# Patient Record
Sex: Male | Born: 1965
Health system: Southern US, Community
[De-identification: ages and names within clinical notes are randomized; demographics above are authoritative.]

## PROBLEM LIST (undated history)

## (undated) DIAGNOSIS — G4733 Obstructive sleep apnea (adult) (pediatric): Secondary | ICD-10-CM

## (undated) DIAGNOSIS — B354 Tinea corporis: Secondary | ICD-10-CM

## (undated) DIAGNOSIS — G4730 Sleep apnea, unspecified: Secondary | ICD-10-CM

## (undated) DIAGNOSIS — E7800 Pure hypercholesterolemia, unspecified: Secondary | ICD-10-CM

## (undated) DIAGNOSIS — M109 Gout, unspecified: Secondary | ICD-10-CM

## (undated) DIAGNOSIS — S838X9A Sprain of other specified parts of unspecified knee, initial encounter: Secondary | ICD-10-CM

## (undated) DIAGNOSIS — R319 Hematuria, unspecified: Secondary | ICD-10-CM

## (undated) DIAGNOSIS — I10 Essential (primary) hypertension: Secondary | ICD-10-CM

## (undated) HISTORY — PX: FINGER SURGERY: SHX640

## (undated) HISTORY — DX: Tinea corporis: B35.4

## (undated) HISTORY — DX: Pure hypercholesterolemia, unspecified: E78.00

## (undated) HISTORY — PX: KNEE SURGERY: SHX244

## (undated) HISTORY — DX: Obstructive sleep apnea (adult) (pediatric): G47.33

## (undated) HISTORY — DX: Sprain of other specified parts of unspecified knee, initial encounter: S83.8X9A

## (undated) HISTORY — PX: POPLITEAL SYNOVIAL CYST EXCISION: SUR555

## (undated) HISTORY — DX: Hematuria, unspecified: R31.9

## (undated) HISTORY — DX: Essential (primary) hypertension: I10

## (undated) HISTORY — DX: Gout, unspecified: M10.9

## (undated) HISTORY — PX: OTHER SURGICAL HISTORY: SHX169

## (undated) HISTORY — DX: Sleep apnea, unspecified: G47.30

---

## 1988-03-15 DIAGNOSIS — S838X9A Sprain of other specified parts of unspecified knee, initial encounter: Secondary | ICD-10-CM

## 1988-03-15 HISTORY — PX: KNEE ARTHROSCOPY W/ MENISCAL REPAIR: SHX1877

## 1988-03-15 HISTORY — DX: Sprain of other specified parts of unspecified knee, initial encounter: S83.8X9A

## 1999-10-31 ENCOUNTER — Emergency Department (HOSPITAL_COMMUNITY): Admission: EM | Admit: 1999-10-31 | Discharge: 1999-10-31 | Payer: Self-pay | Admitting: Emergency Medicine

## 2000-10-12 ENCOUNTER — Ambulatory Visit (HOSPITAL_COMMUNITY): Admission: RE | Admit: 2000-10-12 | Discharge: 2000-10-12 | Payer: Self-pay | Admitting: Surgery

## 2000-10-12 ENCOUNTER — Encounter (INDEPENDENT_AMBULATORY_CARE_PROVIDER_SITE_OTHER): Payer: Self-pay | Admitting: Specialist

## 2009-11-04 ENCOUNTER — Ambulatory Visit: Payer: Self-pay | Admitting: Cardiovascular Disease

## 2010-05-14 ENCOUNTER — Ambulatory Visit (INDEPENDENT_AMBULATORY_CARE_PROVIDER_SITE_OTHER): Payer: BC Managed Care – PPO | Admitting: Cardiovascular Disease

## 2010-05-14 DIAGNOSIS — E7800 Pure hypercholesterolemia, unspecified: Secondary | ICD-10-CM

## 2010-07-31 NOTE — Op Note (Signed)
[place]  Patient:    Jacob Aguirre, Jacob Aguirre                      MRN: [id number] Proc. Date: 10/12/00 Adm. Date:  [id number] Attending:  Andre Lefort CC:         Teena Irani. Arlyce Dice, M.D.   Operative Report  PREOPERATIVE DIAGNOSIS:  Pilonidal cyst/sinus.  POSTOPERATIVE DIAGNOSIS:  Pilonidal cyst/sinus.  OPERATION PERFORMED:  Pilonidal cyst excision.  SURGEON:  Sandria Bales. Ezzard Standing, M.D.  ASSISTANT:  None.  ANESTHESIA:  MAC with approximately 20 cc of 0.25% Marcaine.  COMPLICATIONS:  None.  INDICATIONS FOR PROCEDURE:  The patient is a 45 year old white male who has had recurrent pilonidal cyst infection and now comes for excision of this cyst cavity.  DESCRIPTION OF PROCEDURE:  The patient was placed in a prone position with his buttocks shaved and taped apart.  He was given IV sedation and I used 0.25% Marcaine plain as a local injection as a block.  I then excised an approximately 3 to 4 cm length and about 2 cm width area.  I marked the pilonidal cyst with an injection of methylene blue and excised this entire tract.  I controlled hemostasis with Bovie electrocautery and then packed it with Betadine gauze and sterilely dressed it.  He will start dressing changes at home with sitz baths three times a day and see me back in about five to seven days. DD:  10/12/00 TD:  10/12/00 Job: [id number]

## 2011-03-06 ENCOUNTER — Other Ambulatory Visit: Payer: Self-pay | Admitting: Cardiovascular Disease

## 2011-05-06 ENCOUNTER — Encounter: Payer: Self-pay | Admitting: *Deleted

## 2011-05-10 ENCOUNTER — Telehealth: Payer: Self-pay | Admitting: *Deleted

## 2011-05-10 ENCOUNTER — Other Ambulatory Visit: Payer: 59

## 2011-05-10 DIAGNOSIS — I10 Essential (primary) hypertension: Secondary | ICD-10-CM

## 2011-05-10 DIAGNOSIS — E785 Hyperlipidemia, unspecified: Secondary | ICD-10-CM

## 2011-05-10 NOTE — Telephone Encounter (Signed)
Walk in for labs, orders given

## 2011-05-17 ENCOUNTER — Ambulatory Visit (INDEPENDENT_AMBULATORY_CARE_PROVIDER_SITE_OTHER): Payer: 59 | Admitting: Cardiovascular Disease

## 2011-05-17 ENCOUNTER — Encounter: Payer: Self-pay | Admitting: Cardiovascular Disease

## 2011-05-17 VITALS — BP 130/85 | HR 56 | Ht 71.0 in | Wt 237.8 lb

## 2011-05-17 DIAGNOSIS — G4730 Sleep apnea, unspecified: Secondary | ICD-10-CM

## 2011-05-17 DIAGNOSIS — G4733 Obstructive sleep apnea (adult) (pediatric): Secondary | ICD-10-CM | POA: Insufficient documentation

## 2011-05-17 DIAGNOSIS — E785 Hyperlipidemia, unspecified: Secondary | ICD-10-CM | POA: Insufficient documentation

## 2011-05-17 DIAGNOSIS — I10 Essential (primary) hypertension: Secondary | ICD-10-CM | POA: Insufficient documentation

## 2011-05-17 DIAGNOSIS — R0683 Snoring: Secondary | ICD-10-CM

## 2011-05-17 LAB — LIPID PANEL
Cholesterol: 211 mg/dL — ABNORMAL HIGH (ref 0–200)
HDL: 41.6 mg/dL (ref >39.00)
Total CHOL/HDL Ratio: 5
Triglycerides: 477 mg/dL — ABNORMAL HIGH (ref 0.0–149.0)
VLDL: 95.4 mg/dL — ABNORMAL HIGH (ref 0.0–40.0)

## 2011-05-17 LAB — BASIC METABOLIC PANEL
BUN: 18 mg/dL (ref 6–23)
CO2: 27 mEq/L (ref 19–32)
Calcium: 9.5 mg/dL (ref 8.4–10.5)
Chloride: 103 mEq/L (ref 96–112)
Creatinine, Ser: 1 mg/dL (ref 0.4–1.5)
GFR: 84.43 mL/min (ref >60.00)
Glucose, Bld: 87 mg/dL (ref 70–99)
Potassium: 4.2 mEq/L (ref 3.5–5.1)
Sodium: 137 mEq/L (ref 135–145)

## 2011-05-17 LAB — HEPATIC FUNCTION PANEL
ALT: 49 U/L (ref 0–53)
AST: 32 U/L (ref 0–37)
Albumin: 4.5 g/dL (ref 3.5–5.2)
Alkaline Phosphatase: 42 U/L (ref 39–117)
Bilirubin, Direct: 0 mg/dL (ref 0.0–0.3)
Total Bilirubin: 0.7 mg/dL (ref 0.3–1.2)
Total Protein: 7.3 g/dL (ref 6.0–8.3)

## 2011-05-17 LAB — LDL CHOLESTEROL, DIRECT: Direct LDL: 76.5 mg/dL

## 2011-05-17 NOTE — Progress Notes (Signed)
    Jacob Aguirre Date of Birth  10/29/1965 [facility]     [place]  [address]    [address]   [address] [place], [place]  [address]    [place], [place]  [address] [phone]  Fax  [phone]  [phone]  Fax [phone]  Problems: 1. Hypercholesterolemia 2. Hypertension  History of Present Illness:  Jacob Aguirre has done fairly well since I last saw him. He is trying to eat a little bit better. He's not had any episodes of chest pain or shortness breath.  Current Outpatient Prescriptions on File Prior to Visit  Medication Sig Dispense Refill  . ASPIRIN PO Take 81 mg by mouth daily.       Marland Kitchen lisinopril (PRINIVIL,ZESTRIL) 20 MG tablet Take 20 mg by mouth daily.      . niacin (NIASPAN) 500 MG CR tablet Take 500 mg by mouth at bedtime.      . Omega-3 Fatty Acids (OMEGA 3 PO) Take by mouth 3 (three) times daily.        Allergies  Allergen Reactions  . Niacin And Related     Hives  . Penicillins     Causes Hives    Past Medical History  Diagnosis Date  . HTN (hypertension)   . Hypercholesterolemia     Past Surgical History  Procedure Date  . Finger surgery   . Knee surgery     Broken Knee Cap  . Knee cartilage surgery     Right torn  . Cystectomy     Right leg     History  Smoking status  . Former Smoker  Smokeless tobacco  . Not on file  Comment: Quite is 2002    History  Alcohol Use: Not on file    No family history on file.  Reviw of Systems:  Reviewed in the HPI.  All other systems are negative.  Physical Exam: Blood pressure 142/91, pulse 56, height 5\' 11"  (1.803 m), weight 237 lb 12.8 oz (107.865 kg). General: Well developed, well nourished, in no acute distress.  Head: Normocephalic, atraumatic, sclera non-icteric, mucus membranes are moist,   Neck: Supple. Carotids are 2 + without bruits. No JVD  Lungs: Clear bilaterally to auscultation.  Heart: regular rate  With normal  S1 S2. No murmurs, gallops or  rubs.  Abdomen: Soft, non-tender, non-distended with normal bowel sounds. No hepatomegaly. No rebound/guarding. No masses.  Msk:  Strength and tone are normal  Extremities: No clubbing or cyanosis. No edema.  Distal pedal pulses are 2+ and equal bilaterally.  Neuro: Alert and oriented X 3. Moves all extremities spontaneously.  Psych:  Responds to questions appropriately with a normal affect.  ECG: NSR, no ST or T wave changes.   Assessment / Plan:

## 2011-05-17 NOTE — Assessment & Plan Note (Signed)
We'll check fasting labs on him today. They drew them last week but somehow samples were not reported. I suspect that there was some sort of lab error or labeling mistake

## 2011-05-17 NOTE — Assessment & Plan Note (Signed)
Jacob Aguirre is doing fairly well. We'll continue with a good diet and exercise program program. I've asked him to make sure that he does not need any extra salt.

## 2011-05-17 NOTE — Patient Instructions (Addendum)
Your physician has recommended that you have a sleep study. This test records several body functions during sleep, including: brain activity, eye movement, oxygen and carbon dioxide blood levels, heart rate and rhythm, breathing rate and rhythm, the flow of air through your mouth and nose, snoring, body muscle movements, and chest and belly movement.  Your physician wants you to follow-up in: 6 MONTHS You will receive a reminder letter in the mail two months in advance. If you don't receive a letter, please call our office to schedule the follow-up appointment.  Your physician recommends that you return for a FASTING lipid profile: TODAY SINCE LAB DIDN'T RECEIVE BLOOD FROM 05/10/11

## 2011-05-17 NOTE — Assessment & Plan Note (Signed)
Jacob Aguirre describes symptoms and signs of sleep apnea. He snores quite loudly. His wife states that he quits breathing at night. Given these signs, I think it we should schedule him for a sleep study. This may also explain some of his hypertension.

## 2011-05-18 ENCOUNTER — Other Ambulatory Visit: Payer: Self-pay | Admitting: *Deleted

## 2011-05-18 ENCOUNTER — Telehealth: Payer: Self-pay | Admitting: Cardiovascular Disease

## 2011-05-18 DIAGNOSIS — E785 Hyperlipidemia, unspecified: Secondary | ICD-10-CM

## 2011-05-18 MED ORDER — FENOFIBRATE 160 MG PO TABS: 160.0000 mg | ORAL_TABLET | Freq: Every day | ORAL | Status: DC

## 2011-05-18 NOTE — Telephone Encounter (Signed)
ASKED HIM TO CALL BACK, STATED HE NEEDED A NEW MED ADDED. LABS ORDERED, MED ORDERED. DATE SET FOR LAB REDRAW.

## 2011-05-18 NOTE — Telephone Encounter (Signed)
FU Call: Pt returning call to Jodette. Please return pt call to discuss further.  

## 2011-05-18 NOTE — Progress Notes (Signed)
PT CALLED BACK INSTRUCTIONS GIVEN FROM LABS

## 2011-05-18 NOTE — Telephone Encounter (Signed)
OPENED IN ERROR

## 2011-05-25 ENCOUNTER — Other Ambulatory Visit: Payer: Self-pay | Admitting: Cardiovascular Disease

## 2011-05-25 DIAGNOSIS — E785 Hyperlipidemia, unspecified: Secondary | ICD-10-CM

## 2011-05-25 DIAGNOSIS — I10 Essential (primary) hypertension: Secondary | ICD-10-CM

## 2011-05-25 DIAGNOSIS — R0683 Snoring: Secondary | ICD-10-CM

## 2011-06-01 ENCOUNTER — Ambulatory Visit (HOSPITAL_BASED_OUTPATIENT_CLINIC_OR_DEPARTMENT_OTHER): Payer: 59 | Attending: Cardiovascular Disease | Admitting: Radiology

## 2011-06-01 VITALS — Ht 71.0 in | Wt 239.0 lb

## 2011-06-01 DIAGNOSIS — G4733 Obstructive sleep apnea (adult) (pediatric): Secondary | ICD-10-CM | POA: Insufficient documentation

## 2011-06-01 DIAGNOSIS — R0683 Snoring: Secondary | ICD-10-CM

## 2011-06-13 DIAGNOSIS — G4733 Obstructive sleep apnea (adult) (pediatric): Secondary | ICD-10-CM

## 2011-06-13 DIAGNOSIS — R0609 Other forms of dyspnea: Secondary | ICD-10-CM

## 2011-06-13 DIAGNOSIS — R0989 Other specified symptoms and signs involving the circulatory and respiratory systems: Secondary | ICD-10-CM

## 2011-06-14 NOTE — Procedures (Signed)
Jacob Aguirre, Jacob Aguirre               ACCOUNT NO.:  [id number]  MEDICAL RECORD NO.:  [id number]          PATIENT TYPE:  OUT  LOCATION:  SLEEP CENTER                 FACILITY:  [facility]  PHYSICIAN:  Barbaraann Share, MD,FCCPDATE OF BIRTH:  12-20-67  DATE OF STUDY:  06/01/2011                           NOCTURNAL POLYSOMNOGRAM  REFERRING PHYSICIAN:  Vesta Mixer, M.D.  INDICATION FOR STUDY:  Hypersomnia with sleep apnea.  EPWORTH SLEEPINESS SCORE:  10.  MEDICATIONS:  SLEEP ARCHITECTURE:  The patient had a total sleep time of 356 minutes with no slow-wave sleep and only 78 minutes of REM.  Sleep onset latency was normal at 12 minutes and REM did not occur until late in the titration portion of the study.  Sleep efficiency was poor at 63% during the diagnostic portion, and normal during the titration portion at 91%.  RESPIRATORY DATA:  The patient underwent a split night protocol where he was found to have 119 obstructive events in the first 128 minutes of sleep.  This gave him an apnea-hypopnea index of 56 events per hour during the diagnostic portion of the study.  The events occurred in all body positions, there was loud snoring noted throughout. By protocol, the patient was then fitted with a medium ResMed Mirage Quattro full face mask, and CPAP titration was initiated.  He was found to have resolution of his apneic events with a pressure of 11 cm, however, further titration was done in order to stop breakthrough snoring. However, this just resulted in pressure induced central apneas.  OXYGEN DATA:  His O2 desaturation was low as 81% with the patient's obstructive events.  CARDIAC DATA:  No clinically significant arrhythmias were noted.  MOVEMENT-PARASOMNIA:  The patient had no leg jerks or other abnormal behaviors seen.  IMPRESSIONS-RECOMMENDATIONS:  Split night study reveals severe obstructive sleep apnea/hypopnea syndrome with an AHI of 56 events per hour, and oxygen  desaturation as low as 81%.  The patient was then fitted with a medium ResMed Mirage Quattro full face mask and found to have an optimal CPAP pressure of 11 cm in order to treat his obstructive events.  He did have some mild breakthrough snoring at that pressure, however, attempts to titrate further resulted in pressure induced central apneas.  I would recommend titrating his pressure at home with an auto titrating device with download.  The patient should also be encouraged to work aggressively on weight loss.     Barbaraann Share, MD,FCCP Diplomate, American Board of Sleep Medicine    [facility]/MED[facility]  D:  06/13/2011 16:07:35  T:  06/14/2011 11:91:47  Job:  [id number]

## 2011-06-23 ENCOUNTER — Other Ambulatory Visit: Payer: Self-pay | Admitting: Cardiovascular Disease

## 2011-06-24 NOTE — Telephone Encounter (Signed)
Fax Received. Refill Completed. Miller Edgington Chowoe (R.M.A)   

## 2011-07-27 ENCOUNTER — Telehealth: Payer: Self-pay | Admitting: *Deleted

## 2011-07-27 NOTE — Telephone Encounter (Signed)
i have made several attempts to contact with results, cell phone identifies his first and last name, i left a message asking him to call back / number provided 3-4 times. i left MSG today asking him if he needs a referral to pulmonology for f/u for CPAP fitting for sleep apnea, number provided.

## 2011-07-27 NOTE — Telephone Encounter (Signed)
     Message     Jacob Aguirre has severe sleep apnea. It seems to be effectively treated with the CPAP. He should continue to follow up with Dr. Maple Hudson for his sleep apnea.   ----- Message ----- From: Antony Odea, RN Sent: 07/05/2011 3:59 PM To: Vesta Mixer, MD  I

## 2011-07-28 ENCOUNTER — Other Ambulatory Visit: Payer: Self-pay

## 2011-07-28 ENCOUNTER — Telehealth: Payer: Self-pay | Admitting: Cardiovascular Disease

## 2011-07-28 DIAGNOSIS — G4730 Sleep apnea, unspecified: Secondary | ICD-10-CM

## 2011-07-28 NOTE — Progress Notes (Signed)
Message from Dr Elease Hashimoto is as follows: "Jacob Aguirre has severe sleep apnea. It seems to be effectively treated with the CPAP. He should continue to follow up with Dr. Maple Hudson for his sleep apnea".  Pt states he has never seen Dr Maple Hudson or any other pulmonologist for sleep apnea.  Appt was scheduled with Dr Maple Hudson.

## 2011-07-28 NOTE — Telephone Encounter (Signed)
Pt calling back and given appoint time and date to see dr young about his sleep apnea--pt states he can't make that appoint so i gave him dr Roxy Cedar office number and he states he will call and change his appoint to a time and date he can make--pt agrees--nt

## 2011-07-28 NOTE — Telephone Encounter (Signed)
New Problem:    Patient returned Jacob Aguirre's call.  Please call back.

## 2011-08-18 ENCOUNTER — Other Ambulatory Visit: Payer: 59

## 2011-08-19 ENCOUNTER — Telehealth: Payer: Self-pay | Admitting: *Deleted

## 2011-08-19 NOTE — Telephone Encounter (Signed)
Message copied by Antony Odea on Thu Aug 19, 2011 12:01 PM ------      Message from: Worthy Rancher D      Created: Wed Jul 28, 2011 10:22 AM      Regarding: Appt       I am sorry but I accidentally deleted the staff message on this pt from your basket.  I notified him of his sleep study results and made a referral to Dr Maple Hudson for him.  Appt is on 09/02/11 at 10:30am.  I am still trying to reach him with the date and time.  I called him at the number he gave me this am.  [phone].  Hopefully he will call back.  Debby

## 2011-08-27 ENCOUNTER — Encounter: Payer: Self-pay | Admitting: Pulmonary Disease

## 2011-08-27 ENCOUNTER — Ambulatory Visit (INDEPENDENT_AMBULATORY_CARE_PROVIDER_SITE_OTHER): Payer: BC Managed Care – PPO | Admitting: Pulmonary Disease

## 2011-08-27 VITALS — BP 118/90 | HR 68 | Temp 97.9°F | Ht 71.0 in | Wt 239.2 lb

## 2011-08-27 DIAGNOSIS — G4730 Sleep apnea, unspecified: Secondary | ICD-10-CM

## 2011-08-27 NOTE — Progress Notes (Signed)
Subjective:    Patient ID: Jacob Aguirre, male    DOB: 1965/09/08, 46 y.o.   MRN: [id number]  HPI The patient is a 46 year old male who I've been asked to see for management of obstructive sleep apnea.  He underwent NPSG in March of this year, and was found to have severe obstructive sleep apnea with an AHI of 56 events per hour.  He was started on CPAP during the night, and titrated to an optimal pressure of 11 cm of water.  The patient states his wife complains of loud snoring as well as pauses in his breathing during sleep.  The patient has frequent awakenings at night, and has variable degrees of being rested in the mornings.  The patient has an extremely active job, but does note sleep pressured during the day with inactivity.  He will also follow sleep in the evenings watching television or movies.  He denies any issues with sleepiness while driving.  The patient states that his weight is neutral of the last 2 years, and his Epworth Sleepiness Scale today is 7  Sleep Questionnaire: What time do you typically go to bed?( Between what hours) 9-10 pm How long does it take you to fall asleep? 30 minutes How many times during the night do you wake up? 3 What time do you get out of bed to start your day? 0500 Do you drive or operate heavy machinery in your occupation? How much has your weight changed (up or down) over the past two years? (In pounds) 2 lb (0.907 kg) Have you ever had a sleep study before? Yes If yes, location of study? [facility] If yes, date of study? 05/15/11 Do you currently use CPAP? No Do you wear oxygen at any time? No    Review of Systems  Constitutional: Negative.  Negative for fever and unexpected weight change.  HENT: Negative.  Negative for ear pain, nosebleeds, congestion, sore throat, rhinorrhea, sneezing, trouble swallowing, dental problem, postnasal drip and sinus pressure.   Eyes: Negative.  Negative for redness and itching.  Respiratory: Negative.  Negative for cough,  chest tightness, shortness of breath and wheezing.   Cardiovascular: Negative.  Negative for palpitations and leg swelling.  Gastrointestinal: Negative.  Negative for nausea and vomiting.  Genitourinary: Negative.  Negative for dysuria.  Musculoskeletal: Negative.  Negative for joint swelling.  Skin: Negative.  Negative for rash.  Neurological: Negative.  Negative for headaches.  Hematological: Negative.  Does not bruise/bleed easily.  Psychiatric/Behavioral: Negative.  Negative for dysphoric mood. The patient is not nervous/anxious.        Objective:   Physical Exam Constitutional:  Obese male, no acute distress  HENT:  Nares patent without discharge, deviated septum to right, large turbinates.  Oropharynx without exudate, palate and uvula are moderately elongated.   Eyes:  Perrla, eomi, no scleral icterus  Neck:  No JVD, no TMG  Cardiovascular:  Normal rate, regular rhythm, no rubs or gallops.  No murmurs        Intact distal pulses  Pulmonary :  Normal breath sounds, no stridor or respiratory distress   No rales, rhonchi, or wheezing  Abdominal:  Soft, nondistended, bowel sounds present.  No tenderness noted.   Musculoskeletal:  No lower extremity edema noted.  Lymph Nodes:  No cervical lymphadenopathy noted  Skin:  No cyanosis noted  Neurologic:  Appears sleepy, appropriate, moves all 4 extremities without obvious deficit.         Assessment & Plan:

## 2011-08-27 NOTE — Patient Instructions (Addendum)
Will start on cpap at moderate level.  Please call if you are having tolerance issues.  Work on weight loss followup with me in 6 weeks.

## 2011-08-27 NOTE — Assessment & Plan Note (Signed)
The patient has severe obstructive sleep apnea by his recent sleep study, and is clearly symptomatic during the day and evening.  His wife also reports loud snoring and an abnormal breathing pattern during sleep.  I have had a long discussion with him about sleep apnea, including its impact to his quality of life and cardiovascular health.  I have recommended starting CPAP while he tries to work aggressively on weight loss.  The patient is willing to do this. I will set the patient up on cpap at a moderate pressure level to allow for desensitization, and will troubleshoot the device over the next 4-6weeks if needed.  The pt is to call me if having issues with tolerance.  Will then optimize the pressure once patient is able to wear cpap on a consistent basis.

## 2011-09-02 ENCOUNTER — Institutional Professional Consult (permissible substitution): Payer: 59 | Admitting: Internal Medicine

## 2011-10-13 ENCOUNTER — Encounter: Payer: Self-pay | Admitting: Pulmonary Disease

## 2011-10-13 ENCOUNTER — Ambulatory Visit (INDEPENDENT_AMBULATORY_CARE_PROVIDER_SITE_OTHER): Payer: BC Managed Care – PPO | Admitting: Pulmonary Disease

## 2011-10-13 VITALS — BP 120/82 | HR 69 | Temp 98.3°F | Ht 71.0 in | Wt 238.0 lb

## 2011-10-13 DIAGNOSIS — G4733 Obstructive sleep apnea (adult) (pediatric): Secondary | ICD-10-CM

## 2011-10-13 NOTE — Progress Notes (Signed)
  Subjective:    Patient ID: Jacob Aguirre, male    DOB: 24-Dec-1965, 46 y.o.   MRN: [id number]  HPI The patient comes in today for followup of his obstructive sleep apnea.  He was started on CPAP at the last visit, and has been fairly compliant with this device.  He did very well the first 2 weeks, but the last 2 weeks he has not worn as consistently.  He has seen a definite improvement in his sleep and daytime alertness.  He is having no issues with his mask fit, but is having difficulties finding his optimal humidity level.   Review of Systems  Constitutional: Negative for fever and unexpected weight change.  HENT: Negative for ear pain, nosebleeds, congestion, sore throat, rhinorrhea, sneezing, trouble swallowing, dental problem, postnasal drip and sinus pressure.   Eyes: Negative for redness and itching.  Respiratory: Negative for cough, chest tightness, shortness of breath and wheezing.   Cardiovascular: Negative for palpitations and leg swelling.  Gastrointestinal: Negative for nausea and vomiting.  Genitourinary: Negative for dysuria.  Musculoskeletal: Negative for joint swelling.  Skin: Negative for rash.  Neurological: Negative for headaches.  Hematological: Does not bruise/bleed easily.  Psychiatric/Behavioral: Negative for dysphoric mood. The patient is not nervous/anxious.   All other systems reviewed and are negative.       Objective:   Physical Exam Overweight male in no acute distress No skin breakdown or pressure necrosis from the CPAP mask Lower extremities without edema, no cyanosis Alert and oriented, moves all 4 extremities.       Assessment & Plan:

## 2011-10-13 NOTE — Patient Instructions (Addendum)
Will optimize your pressure on the auto setting for the next few weeks.  Will call you with the results, but let us know if you would rather stay on the auto setting.  Work on weight loss followup with me in 6mos, but call if having issues.

## 2011-10-13 NOTE — Assessment & Plan Note (Signed)
The patient has been started on CPAP and has seen improvement in his symptoms with the device.  I have asked him to be as compliant as possible, and also to work aggressively on weight loss.  We need to optimize his pressure, and we'll do this on the automatic setting for the next few weeks.  I will call him with the results. Care Plan:  At this point, will arrange for the patient's machine to be changed over to auto mode for 2 weeks to optimize their pressure.  I will review the downloaded data once sent by dme, and also evaluate for compliance, leaks, and residual osa.  I will call the patient and dme to discuss the results, and have the patient's machine set appropriately.  This will serve as the pt's cpap pressure titration.

## 2011-11-30 ENCOUNTER — Encounter: Payer: Self-pay | Admitting: Cardiovascular Disease

## 2011-11-30 ENCOUNTER — Ambulatory Visit (INDEPENDENT_AMBULATORY_CARE_PROVIDER_SITE_OTHER): Payer: BC Managed Care – PPO | Admitting: Cardiovascular Disease

## 2011-11-30 VITALS — BP 119/83 | HR 69 | Ht 71.0 in | Wt 237.2 lb

## 2011-11-30 DIAGNOSIS — I10 Essential (primary) hypertension: Secondary | ICD-10-CM

## 2011-11-30 NOTE — Patient Instructions (Addendum)
Your physician wants you to follow-up in: 1 year  You will receive a reminder letter in the mail two months in advance. If you don't receive a letter, please call our office to schedule the follow-up appointment.   Your physician recommends that you return for a FASTING lipid profile: 1 year   

## 2011-11-30 NOTE — Assessment & Plan Note (Addendum)
Jacob Aguirre's  blood pressure looks quite a bit better today.  He was diagnosed with obstructive sleep apnea and has been wearing his CPAP. This quite likely has helped his blood pressure control.   I've encouraged him to continue to try to avoid eating any extra salt. He will continue with the same medications. I'll see him again in one year for followup office visit.

## 2011-11-30 NOTE — Progress Notes (Signed)
    Gjon Letarte Tworek Date of Birth  Sep 22, 1965 [facility]     [place]  [address]    [address]   [address] [place], [place]  [address]    [place], [place]  [address] [phone]  Fax  [phone]  [phone]  Fax [phone]  Problems: 1. Hypercholesterolemia 2. Hypertension 3. Obstructive sleep apnea  History of Present Illness:  Coye has done fairly well since I last saw him. He is trying to eat a little bit better. He's not had any episodes of chest pain or shortness breath.  Current Outpatient Prescriptions on File Prior to Visit  Medication Sig Dispense Refill  . ASPIRIN PO Take 81 mg by mouth daily.       . fenofibrate 160 MG tablet Take 1 tablet (160 mg total) by mouth daily.  30 tablet  4  . lisinopril (PRINIVIL,ZESTRIL) 20 MG tablet TAKE ONE TABLET BY MOUTH EVERY DAY  90 tablet  3  . NIASPAN 1000 MG CR tablet TAKE ONE TABLET BY MOUTH AT BEDTIME  30 each  5  . Omega-3 Fatty Acids (OMEGA 3 PO) Take by mouth 3 (three) times daily.        Allergies  Allergen Reactions  . Niacin And Related     Hives  . Penicillins     Causes Hives    Past Medical History  Diagnosis Date  . HTN (hypertension)   . Hypercholesterolemia     Past Surgical History  Procedure Date  . Finger surgery   . Knee surgery     Broken Knee Cap  . Knee cartilage surgery     Right torn  . Cystectomy     Right leg     History  Smoking status  . Former Smoker -- 0.5 packs/day for 3 years  . Types: Cigarettes  . Quit date: 03/15/2000  Smokeless tobacco  . Not on file  Comment: Quit is 2002    History  Alcohol Use  . Yes    Family History  Problem Relation Age of Onset  . Hypertension Mother     Reviw of Systems:  Reviewed in the HPI.  All other systems are negative.  Physical Exam: Blood pressure 119/83, pulse 69, height 5\' 11"  (1.803 m), weight 237 lb 3.2 oz (107.593 kg). General: Well developed, well nourished, in no acute  distress.  Head: Normocephalic, atraumatic, sclera non-icteric, mucus membranes are moist,   Neck: Supple. Carotids are 2 + without bruits. No JVD  Lungs: Clear bilaterally to auscultation.  Heart: regular rate  With normal  S1 S2. No murmurs, gallops or rubs.  Abdomen: Soft, non-tender, non-distended with normal bowel sounds. No hepatomegaly. No rebound/guarding. No masses.  Msk:  Strength and tone are normal  Extremities: No clubbing or cyanosis. No edema.  Distal pedal pulses are 2+ and equal bilaterally.  Neuro: Alert and oriented X 3. Moves all extremities spontaneously.  Psych:  Responds to questions appropriately with a normal affect.  ECG:  Assessment / Plan:

## 2012-04-10 ENCOUNTER — Ambulatory Visit: Payer: BC Managed Care – PPO | Admitting: Pulmonary Disease

## 2012-05-24 ENCOUNTER — Encounter: Payer: Self-pay | Admitting: Pulmonary Disease

## 2012-05-24 ENCOUNTER — Ambulatory Visit (INDEPENDENT_AMBULATORY_CARE_PROVIDER_SITE_OTHER): Payer: BC Managed Care – PPO | Admitting: Pulmonary Disease

## 2012-05-24 VITALS — BP 150/90 | HR 77 | Temp 98.4°F | Ht 71.0 in | Wt 241.2 lb

## 2012-05-24 DIAGNOSIS — G4733 Obstructive sleep apnea (adult) (pediatric): Secondary | ICD-10-CM

## 2012-05-24 NOTE — Assessment & Plan Note (Signed)
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  We will try to sort all this out, but he still needs to have his pressure optimized.  I have also encouraged him to work aggressively on weight loss, and to keep up with his mask changes and supplies.

## 2012-05-24 NOTE — Patient Instructions (Addendum)
Will try to sort all of this out with your medical equipment company Can leave your cpap machine on 10 for now, but we still need to optimize your pressure Work on weight loss Will call you to discuss further once we have more information about all of this.

## 2012-05-24 NOTE — Progress Notes (Signed)
  Subjective:    Patient ID: Jacob Aguirre, male    DOB: November 01, 1965, 47 y.o.   MRN: [id number]  HPI Patient comes in today for followup of his obstructive sleep apnea.  The patient has multiple complaints about his medical equipment company, and currently is on CPAP of 10 cm by his history because he was unable to tolerate the auto setting.  We never received his download from last year, and we'll try and get this from his medical equipment company.  The patient states the CPAP definitely helps his sleep and daytime alertness.   Review of Systems  Constitutional: Negative for fever and unexpected weight change.  HENT: Negative for ear pain, nosebleeds, congestion, sore throat, rhinorrhea, sneezing, trouble swallowing, dental problem, postnasal drip and sinus pressure.   Eyes: Negative for redness and itching.  Respiratory: Negative for cough, chest tightness, shortness of breath and wheezing.   Cardiovascular: Negative for palpitations and leg swelling.  Gastrointestinal: Negative for nausea and vomiting.  Genitourinary: Negative for dysuria.  Musculoskeletal: Negative for joint swelling.  Skin: Negative for rash.  Neurological: Negative for headaches.  Hematological: Does not bruise/bleed easily.  Psychiatric/Behavioral: Negative for dysphoric mood. The patient is not nervous/anxious.        Objective:   Physical Exam Obese male in no acute distress Nose without purulence or discharge noted No skin breakdown or pressure necrosis from the CPAP mask Neck without lymphadenopathy or thyromegaly Lower extremities without edema, cyanosis Alert and oriented, moves all 4 extremities.       Assessment & Plan:

## 2012-06-01 ENCOUNTER — Ambulatory Visit (INDEPENDENT_AMBULATORY_CARE_PROVIDER_SITE_OTHER): Payer: BC Managed Care – PPO | Admitting: Pulmonary Disease

## 2012-06-01 DIAGNOSIS — G4733 Obstructive sleep apnea (adult) (pediatric): Secondary | ICD-10-CM

## 2012-06-20 ENCOUNTER — Telehealth: Payer: Self-pay | Admitting: Pulmonary Disease

## 2012-06-20 NOTE — Telephone Encounter (Signed)
Pt needs ov soon to review results. ?this week worked in somewhere?

## 2012-06-20 NOTE — Telephone Encounter (Signed)
Pt is coming in tomorrow at 3:00. Will forward to [facility] as an [place]

## 2012-06-20 NOTE — Telephone Encounter (Signed)
Please advise pt requesting sleep study results KC thanks

## 2012-06-21 ENCOUNTER — Encounter: Payer: Self-pay | Admitting: Pulmonary Disease

## 2012-06-21 ENCOUNTER — Ambulatory Visit (INDEPENDENT_AMBULATORY_CARE_PROVIDER_SITE_OTHER): Payer: BC Managed Care – PPO | Admitting: Pulmonary Disease

## 2012-06-21 VITALS — BP 142/86 | HR 72 | Temp 97.6°F | Ht 71.0 in | Wt 235.8 lb

## 2012-06-21 DIAGNOSIS — G4733 Obstructive sleep apnea (adult) (pediatric): Secondary | ICD-10-CM

## 2012-06-21 NOTE — Progress Notes (Signed)
  Subjective:    Patient ID: Jacob Aguirre, male    DOB: 1965/06/19, 47 y.o.   MRN: [id number]  HPI The patient comes in today for followup after his recent sleep test.  He has a known history of severe obstructive sleep apnea, and was started on CPAP.  There apparently was issues with tolerance of CPAP compliance, but the patient was also concerned his machine was not functioning properly.  He maintains that he was wearing the device regularly.  He has now had a home sleep test which re- verified his severe sleep apnea.  He will need to be restarted on CPAP.   Review of Systems  Constitutional: Negative for fever and unexpected weight change.  HENT: Negative for ear pain, nosebleeds, congestion, sore throat, rhinorrhea, sneezing, trouble swallowing, dental problem, postnasal drip and sinus pressure.   Eyes: Negative for redness and itching.  Respiratory: Negative for cough, chest tightness, shortness of breath and wheezing.   Cardiovascular: Negative for palpitations and leg swelling.  Gastrointestinal: Negative for nausea and vomiting.  Genitourinary: Negative for dysuria.  Musculoskeletal: Negative for joint swelling.  Skin: Negative for rash.  Neurological: Negative for headaches.  Hematological: Does not bruise/bleed easily.  Psychiatric/Behavioral: Negative for dysphoric mood. The patient is not nervous/anxious.        Objective:   Physical Exam Overweight male in no acute distress Nose without purulence or discharge noted Neck without lymphadenopathy or thyromegaly Chest clear to auscultation Cardiac exam with regular rate and rhythm Lower extremities have no significant edema, no cyanosis Awake, but does appear sleepy, moves all 4 extremities.       Assessment & Plan:

## 2012-06-21 NOTE — Assessment & Plan Note (Signed)
The patient's home sleep test has reaffirmed that he has severe obstructive sleep apnea, and we'll therefore get him set up with a durable medical equipment company to restart on CPAP.  I have stressed to him the importance of good compliance, and that his degree of sleep apnea is a significant cardiovascular health risk.  I have also encouraged him to work aggressively on weight loss.

## 2012-06-21 NOTE — Patient Instructions (Addendum)
Will get you restarted on cpap Work on weight loss followup with me in 2mos, and bring your machine with you so we can download.

## 2012-06-22 ENCOUNTER — Other Ambulatory Visit: Payer: Self-pay | Admitting: Pulmonary Disease

## 2012-06-22 DIAGNOSIS — G4733 Obstructive sleep apnea (adult) (pediatric): Secondary | ICD-10-CM

## 2012-08-21 ENCOUNTER — Ambulatory Visit (INDEPENDENT_AMBULATORY_CARE_PROVIDER_SITE_OTHER): Payer: BC Managed Care – PPO | Admitting: Pulmonary Disease

## 2012-08-21 ENCOUNTER — Encounter: Payer: Self-pay | Admitting: Pulmonary Disease

## 2012-08-21 VITALS — BP 120/80 | HR 68 | Temp 98.2°F | Ht 71.0 in | Wt 232.6 lb

## 2012-08-21 DIAGNOSIS — G4733 Obstructive sleep apnea (adult) (pediatric): Secondary | ICD-10-CM

## 2012-08-21 NOTE — Assessment & Plan Note (Signed)
The patient is doing well on his current CPAP setting, but I have asked him to try and increase his total sleep time.  This will require him to make lifestyle changes.  I've also asked him to keep up with his mask changes and supplies, and to work aggressively on weight loss.

## 2012-08-21 NOTE — Patient Instructions (Addendum)
Will keep machine on auto as it is now Work on weight loss followup with me in 6mos if doing well.

## 2012-08-21 NOTE — Progress Notes (Signed)
  Subjective:    Patient ID: Jacob Aguirre, male    DOB: 05/18/1965, 47 y.o.   MRN: [id number]  HPI The patient comes in today for followup of his obstructive sleep apnea.  He had been having difficulty meeting use requirements, but has done much better since the machine has been on automatic.  His deltoid today shows approximately 60% of the time at 4 hours or greater, and excellent control of his sleep apnea with his current pressure settings between 5 and 15.  He is having no issues with his mask fit or pressure, and feels that he sleeps well with the device with excellent daytime alertness.  His main issue is that he is working different jobs, and his current lifestyle often keeps him from wearing the device more than 4 or 5 hours.   Review of Systems  Constitutional: Negative for fever and unexpected weight change.  HENT: Negative for ear pain, nosebleeds, congestion, sore throat, rhinorrhea, sneezing, trouble swallowing, dental problem, postnasal drip and sinus pressure.   Eyes: Negative for redness and itching.  Respiratory: Negative for cough, chest tightness, shortness of breath and wheezing.   Cardiovascular: Negative for palpitations and leg swelling.  Gastrointestinal: Negative for nausea and vomiting.  Genitourinary: Negative for dysuria.  Musculoskeletal: Negative for joint swelling.  Skin: Negative for rash.  Neurological: Negative for headaches.  Hematological: Bruises/bleeds easily.  Psychiatric/Behavioral: Negative for dysphoric mood. The patient is not nervous/anxious.        Objective:   Physical Exam Obese male in no acute distress Nose without purulence or discharge noted No skin breakdown or pressure necrosis from the CPAP mask Lower extremities without edema, no cyanosis Alert and oriented, moves all 4 extremities.       Assessment & Plan:

## 2012-08-31 ENCOUNTER — Other Ambulatory Visit: Payer: Self-pay | Admitting: Cardiovascular Disease

## 2012-09-04 ENCOUNTER — Other Ambulatory Visit: Payer: Self-pay | Admitting: *Deleted

## 2012-09-04 MED ORDER — LISINOPRIL 20 MG PO TABS: 20.0000 mg | ORAL_TABLET | Freq: Every day | ORAL | Status: DC

## 2012-09-04 NOTE — Telephone Encounter (Signed)
NEED APPOINTMENT Fax Received. Refill Completed. Jacob Aguirre (R.M.A)   

## 2012-09-04 NOTE — Telephone Encounter (Signed)
left message for pt to call office back. number provided. would like to know if pt is taking his Niacin that pharmacy is requesting but this medication is on pt's allergy list.

## 2012-09-07 ENCOUNTER — Other Ambulatory Visit: Payer: Self-pay | Admitting: *Deleted

## 2012-09-07 NOTE — Telephone Encounter (Signed)
Opened in Error.

## 2012-09-11 ENCOUNTER — Other Ambulatory Visit: Payer: Self-pay | Admitting: *Deleted

## 2012-09-11 NOTE — Telephone Encounter (Signed)
Opened in Error.

## 2012-09-13 ENCOUNTER — Other Ambulatory Visit: Payer: Self-pay | Admitting: *Deleted

## 2012-09-14 ENCOUNTER — Other Ambulatory Visit: Payer: Self-pay | Admitting: *Deleted

## 2012-09-14 MED ORDER — NIACIN ER (ANTIHYPERLIPIDEMIC) 1000 MG PO TBCR: 1000.0000 mg | EXTENDED_RELEASE_TABLET | Freq: Every day | ORAL | Status: DC

## 2012-09-14 NOTE — Telephone Encounter (Signed)
Fax Received. Refill Completed. Jacob Aguirre (R.M.A)   

## 2012-09-24 ENCOUNTER — Other Ambulatory Visit: Payer: Self-pay | Admitting: Pulmonary Disease

## 2012-11-28 ENCOUNTER — Other Ambulatory Visit: Payer: Self-pay | Admitting: *Deleted

## 2012-11-28 MED ORDER — LISINOPRIL 20 MG PO TABS: 20.0000 mg | ORAL_TABLET | Freq: Every day | ORAL | Status: DC

## 2012-11-28 MED ORDER — FENOFIBRATE 160 MG PO TABS: ORAL_TABLET | ORAL | Status: DC

## 2013-01-31 ENCOUNTER — Encounter: Payer: Self-pay | Admitting: Cardiovascular Disease

## 2013-02-02 ENCOUNTER — Ambulatory Visit: Payer: BC Managed Care – PPO | Admitting: Cardiovascular Disease

## 2013-02-02 ENCOUNTER — Ambulatory Visit (INDEPENDENT_AMBULATORY_CARE_PROVIDER_SITE_OTHER): Payer: BC Managed Care – PPO | Admitting: *Deleted

## 2013-02-02 DIAGNOSIS — E785 Hyperlipidemia, unspecified: Secondary | ICD-10-CM

## 2013-02-02 DIAGNOSIS — I10 Essential (primary) hypertension: Secondary | ICD-10-CM

## 2013-02-02 LAB — HEPATIC FUNCTION PANEL
ALT: 52 U/L (ref 0–53)
AST: 34 U/L (ref 0–37)
Albumin: 4.4 g/dL (ref 3.5–5.2)
Alkaline Phosphatase: 37 U/L — ABNORMAL LOW (ref 39–117)
Bilirubin, Direct: 0.1 mg/dL (ref 0.0–0.3)
Total Bilirubin: 1.1 mg/dL (ref 0.3–1.2)
Total Protein: 7 g/dL (ref 6.0–8.3)

## 2013-02-02 LAB — BASIC METABOLIC PANEL
BUN: 21 mg/dL (ref 6–23)
CO2: 29 mEq/L (ref 19–32)
Calcium: 9.7 mg/dL (ref 8.4–10.5)
Chloride: 100 mEq/L (ref 96–112)
Creatinine, Ser: 0.9 mg/dL (ref 0.4–1.5)
GFR: 93.2 mL/min (ref >60.00)
Glucose, Bld: 97 mg/dL (ref 70–99)
Potassium: 4.5 mEq/L (ref 3.5–5.1)
Sodium: 134 mEq/L — ABNORMAL LOW (ref 135–145)

## 2013-02-02 LAB — LIPID PANEL
Cholesterol: 203 mg/dL — ABNORMAL HIGH (ref 0–200)
HDL: 33.1 mg/dL — ABNORMAL LOW (ref >39.00)
Total CHOL/HDL Ratio: 6
Triglycerides: 151 mg/dL — ABNORMAL HIGH (ref 0.0–149.0)
VLDL: 30.2 mg/dL (ref 0.0–40.0)

## 2013-02-02 LAB — LDL CHOLESTEROL, DIRECT: Direct LDL: 141.3 mg/dL

## 2013-02-12 ENCOUNTER — Ambulatory Visit (INDEPENDENT_AMBULATORY_CARE_PROVIDER_SITE_OTHER): Payer: BC Managed Care – PPO | Admitting: Cardiovascular Disease

## 2013-02-12 ENCOUNTER — Encounter: Payer: Self-pay | Admitting: Cardiovascular Disease

## 2013-02-12 VITALS — BP 110/70 | HR 63 | Ht 71.0 in | Wt 231.0 lb

## 2013-02-12 DIAGNOSIS — I10 Essential (primary) hypertension: Secondary | ICD-10-CM

## 2013-02-12 DIAGNOSIS — E785 Hyperlipidemia, unspecified: Secondary | ICD-10-CM

## 2013-02-12 NOTE — Assessment & Plan Note (Signed)
His BP is well controlled.  continue same meds.

## 2013-02-12 NOTE — Patient Instructions (Signed)
Your physician wants you to follow-up in: 1 year You will receive a reminder letter in the mail two months in advance. If you don't receive a letter, please call our office to schedule the follow-up appointment.   Your physician recommends that you return for a FASTING lipid profile: 1 year / 2-7 days prior to office visit,   Your physician recommends that you continue on your current medications as directed. Please refer to the Current Medication list given to you today.

## 2013-02-12 NOTE — Assessment & Plan Note (Addendum)
His lipids are ok but still a bit high.    He has lost 10 lbs.  He has had a gout attack and so am cautious about increasing his Niaspan. He's been greatly cut back the fats in his diet. He also has been limiting his carbohydrate.\  I seen again in one year. We'll check fasting labs at that time.

## 2013-02-12 NOTE — Progress Notes (Signed)
    Jacob Aguirre Date of Birth  11/21/1965 [facility]     [place]  [address]    [address]   [address] [place], [place]  [address]    [place], [place]  [address] [phone]  Fax  [phone]  [phone]  Fax [phone]  Problems: 1. Hypercholesterolemia 2. Hypertension 3. Obstructive sleep apnea  History of Present Illness:  Jacob Aguirre has done fairly well since I last saw him. He is trying to eat a little bit better. He's not had any episodes of chest pain or shortness breath.  02/12/2013  Jacob Aguirre is doing well.  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  No Cp , no dyspnea.   Current Outpatient Prescriptions on File Prior to Visit  Medication Sig Dispense Refill  . ASPIRIN PO Take 81 mg by mouth daily.       . fenofibrate 160 MG tablet TAKE ONE TABLET BY MOUTH DAILY.  30 tablet  2  . lisinopril (PRINIVIL,ZESTRIL) 20 MG tablet Take 1 tablet (20 mg total) by mouth daily.  30 tablet  2  . niacin (NIASPAN) 1000 MG CR tablet Take 1 tablet (1,000 mg total) by mouth at bedtime.  30 tablet  2  . Omega-3 Fatty Acids (OMEGA 3 PO) Take by mouth 3 (three) times daily.       No current facility-administered medications on file prior to visit.    Allergies  Allergen Reactions  . Niacin And Related     Hives  . Penicillins     Causes Hives    Past Medical History  Diagnosis Date  . HTN (hypertension)   . Hypercholesterolemia     Past Surgical History  Procedure Laterality Date  . Finger surgery    . Knee surgery      Broken Knee Cap  . Knee cartilage surgery      Right torn  . Cystectomy      Right leg     History  Smoking status  . Former Smoker -- 0.50 packs/day for 3 years  . Types: Cigarettes  . Quit date: 03/15/1990  Smokeless tobacco  . Not on file    Comment: Quit is 2002    History  Alcohol Use  . Yes    Family History  Problem Relation Age of Onset  . Hypertension Mother     Reviw of  Systems:  Reviewed in the HPI.  All other systems are negative.  Physical Exam: Blood pressure 110/70, pulse 63, height 5\' 11"  (1.803 m), weight 231 lb (104.781 kg). General: Well developed, well nourished, in no acute distress.  Head: Normocephalic, atraumatic, sclera non-icteric, mucus membranes are moist,   Neck: Supple. Carotids are 2 + without bruits. No JVD  Lungs: Clear bilaterally to auscultation.  Heart: regular rate  With normal  S1 S2. No murmurs, gallops or rubs.  Abdomen: Soft, non-tender, non-distended with normal bowel sounds. No hepatomegaly. No rebound/guarding. No masses.  Msk:  Strength and tone are normal  Extremities: No clubbing or cyanosis. No edema.  Distal pedal pulses are 2+ and equal bilaterally.  Neuro: Alert and oriented X 3. Moves all extremities spontaneously.  Psych:  Responds to questions appropriately with a normal affect.  ECG: Dec. 1, 2014:  NSR at 63.  Normal ECG   Assessment / Plan:

## 2013-02-13 ENCOUNTER — Ambulatory Visit (INDEPENDENT_AMBULATORY_CARE_PROVIDER_SITE_OTHER): Payer: BC Managed Care – PPO | Admitting: Pulmonary Disease

## 2013-02-13 ENCOUNTER — Encounter: Payer: Self-pay | Admitting: Pulmonary Disease

## 2013-02-13 VITALS — BP 138/86 | HR 67 | Temp 98.0°F | Ht 71.0 in | Wt 232.6 lb

## 2013-02-13 DIAGNOSIS — G4733 Obstructive sleep apnea (adult) (pediatric): Secondary | ICD-10-CM

## 2013-02-13 NOTE — Patient Instructions (Signed)
Continue with cpap, and call if you are having issues with tolerance or getting supplies from your homecare company Work on weight loss followup with me in one year if doing well.

## 2013-02-13 NOTE — Progress Notes (Signed)
   Subjective:    Patient ID: Jacob Aguirre, male    DOB: November 21, 1965, 47 y.o.   MRN: [id number]  HPI The patient comes in today for followup of his obstructive sleep apnea. He is wearing CPAP compliantly, and is having no issues with his mask fit or pressure. He feels that he sleeps well, and is satisfied with his daytime alertness.   Review of Systems  Constitutional: Negative for fever and unexpected weight change.  HENT: Negative for congestion, dental problem, ear pain, nosebleeds, postnasal drip, rhinorrhea, sinus pressure, sneezing, sore throat and trouble swallowing.   Eyes: Negative for redness and itching.  Respiratory: Negative for cough, chest tightness, shortness of breath and wheezing.   Cardiovascular: Negative for palpitations and leg swelling.  Gastrointestinal: Negative for nausea and vomiting.  Genitourinary: Negative for dysuria.  Musculoskeletal: Negative for joint swelling.  Skin: Negative for rash.  Neurological: Negative for headaches.  Hematological: Does not bruise/bleed easily.  Psychiatric/Behavioral: Negative for dysphoric mood. The patient is not nervous/anxious.        Objective:   Physical Exam Overweight male in no acute distress Nose without purulence or discharge noted No skin breakdown or pressure necrosis from the CPAP mask Neck without lymphadenopathy or thyromegaly Lower extremities without edema, no cyanosis Alert and oriented, moves all 4 extremities       Assessment & Plan:

## 2013-02-13 NOTE — Assessment & Plan Note (Signed)
The patient is doing very well from a sleep apnea standpoint on CPAP. He is satisfied with his sleep and daytime alertness, and is having no issues with his device. I have encouraged him to work on weight loss, and to keep up with his mask changes and supplies.

## 2013-02-16 ENCOUNTER — Other Ambulatory Visit: Payer: Self-pay | Admitting: Cardiovascular Disease

## 2013-10-08 ENCOUNTER — Other Ambulatory Visit: Payer: Self-pay | Admitting: Cardiovascular Disease

## 2014-01-17 ENCOUNTER — Other Ambulatory Visit: Payer: Self-pay | Admitting: *Deleted

## 2014-01-17 MED ORDER — FENOFIBRATE 160 MG PO TABS: ORAL_TABLET | ORAL | Status: DC

## 2014-01-17 MED ORDER — LISINOPRIL 20 MG PO TABS: ORAL_TABLET | ORAL | Status: DC

## 2014-02-13 ENCOUNTER — Ambulatory Visit (INDEPENDENT_AMBULATORY_CARE_PROVIDER_SITE_OTHER): Payer: BC Managed Care – PPO | Admitting: Pulmonary Disease

## 2014-02-13 ENCOUNTER — Encounter: Payer: Self-pay | Admitting: Pulmonary Disease

## 2014-02-13 VITALS — BP 126/78 | HR 63 | Temp 97.4°F | Ht 71.0 in | Wt 234.8 lb

## 2014-02-13 DIAGNOSIS — G4733 Obstructive sleep apnea (adult) (pediatric): Secondary | ICD-10-CM

## 2014-02-13 NOTE — Assessment & Plan Note (Signed)
The patient feels that he is doing very well with his C Pap device on its current setting. He feels that his sleep is adequate, and is satisfied with his daytime alertness. I have asked him to work aggressively on weight loss, and also to keep up with his mask changes and supplies.

## 2014-02-13 NOTE — Progress Notes (Signed)
   Subjective:    Patient ID: Jacob Aguirre, male    DOB: 07/01/1965, 48 y.o.   MRN: [id number]  HPI The patient comes in today for follow-up of his known obstructive sleep apnea. He tells me that he is wearing his device compliantly, and is very satisfied with his sleep and daytime alertness. He is not having any mask leak issues, or problems with the pressure. Note, the patient's weight is stable from the last visit.   Review of Systems  Constitutional: Negative for fever and unexpected weight change.  HENT: Negative for congestion, dental problem, ear pain, nosebleeds, postnasal drip, rhinorrhea, sinus pressure, sneezing, sore throat and trouble swallowing.   Eyes: Negative for redness and itching.  Respiratory: Negative for cough, chest tightness, shortness of breath and wheezing.   Cardiovascular: Negative for palpitations and leg swelling.  Gastrointestinal: Negative for nausea and vomiting.  Genitourinary: Negative for dysuria.  Musculoskeletal: Negative for joint swelling.  Skin: Negative for rash.  Neurological: Negative for headaches.  Hematological: Does not bruise/bleed easily.  Psychiatric/Behavioral: Negative for dysphoric mood. The patient is not nervous/anxious.        Objective:   Physical Exam Overweight male in no acute distress Nose without purulence or discharge noted Neck without lymphadenopathy or thyromegaly No skin breakdown or pressure necrosis from the C Pap mask Lower extremities without edema, no cyanosis Alert and oriented, moves all 4 extremities.       Assessment & Plan:

## 2014-02-13 NOTE — Patient Instructions (Signed)
Continue on cpap, and keep up with mask changes and supplies. Work on weight loss followup with me again in one year.  

## 2014-02-14 ENCOUNTER — Encounter: Payer: Self-pay | Admitting: Cardiovascular Disease

## 2014-02-14 ENCOUNTER — Ambulatory Visit (INDEPENDENT_AMBULATORY_CARE_PROVIDER_SITE_OTHER): Payer: BC Managed Care – PPO | Admitting: Cardiovascular Disease

## 2014-02-14 VITALS — BP 120/90 | HR 60 | Ht 71.0 in | Wt 230.0 lb

## 2014-02-14 DIAGNOSIS — E785 Hyperlipidemia, unspecified: Secondary | ICD-10-CM

## 2014-02-14 DIAGNOSIS — I10 Essential (primary) hypertension: Secondary | ICD-10-CM

## 2014-02-14 MED ORDER — LISINOPRIL 20 MG PO TABS: ORAL_TABLET | ORAL | Status: DC

## 2014-02-14 NOTE — Assessment & Plan Note (Signed)
His last lipids revealed mildly elevated LDL and continue to work on diet and exercise program.  I will see him again in one year. Repeat labs in 1 year.

## 2014-02-14 NOTE — Patient Instructions (Signed)
Your physician recommends that you return for lab work tomorrow:  Cholesterol, liver, bmet You will need to FAST for this appointment - nothing to eat or drink after midnight the night before except water.  Your physician recommends that you continue on your current medications as directed. Please refer to the Current Medication list given to you today.  Your physician wants you to follow-up in: 1 year with Dr. Acie Fredrickson.  You will receive a reminder letter in the mail two months in advance. If you don't receive a letter, please call our office to schedule the follow-up appointment.

## 2014-02-14 NOTE — Assessment & Plan Note (Addendum)
Blood pressure seems to be fairly well controlled. Continue to watch diet. Will see him in 1 year

## 2014-02-14 NOTE — Progress Notes (Signed)
    Jacob Aguirre Date of Birth  24-Sep-1965 Jacob Aguirre [address]    [place], [place]  [address]    [place], [place]  [address] [phone]  Fax  [phone]  [phone]  Fax [phone]  Problems: 1. Hypercholesterolemia 2. Hypertension 3. Obstructive sleep apnea  History of Present Illness:  Jacob Aguirre has done fairly well since I last saw him. He is trying to eat a little bit better. He's not had any episodes of chest pain or shortness breath.  02/12/2013  Jacob Aguirre is doing well.  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  No Cp , no dyspnea.  Dec. 3, 2015:  Jacob Aguirre is seen for his hyperlipidemia and HTN.  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   Current Outpatient Prescriptions on File Prior to Visit  Medication Sig Dispense Refill  . ASPIRIN PO Take 81 mg by mouth daily.     . fenofibrate 160 MG tablet TAKE ONE TABLET BY MOUTH ONCE DAILY 30 tablet 0  . lisinopril (PRINIVIL,ZESTRIL) 20 MG tablet TAKE ONE TABLET BY MOUTH ONCE DAILY 30 tablet 0  . niacin (NIASPAN) 1000 MG CR tablet Take 1 tablet (1,000 mg total) by mouth at bedtime. 30 tablet 2  . Omega-3 Fatty Acids (OMEGA 3 PO) Take by mouth 3 (three) times daily.     No current facility-administered medications on file prior to visit.    Allergies  Allergen Reactions  . Niacin And Related     Hives  . Penicillins     Causes Hives    Past Medical History  Diagnosis Date  . HTN (hypertension)   . Hypercholesterolemia     Past Surgical History  Procedure Laterality Date  . Finger surgery    . Knee surgery      Broken Knee Cap  . Knee cartilage surgery      Right torn  . Cystectomy      Right leg     History  Smoking status  . Former Smoker -- 0.50 packs/day for 3 years  . Types: Cigarettes  . Quit date: 03/15/1990  Smokeless tobacco  . Not on file    History  Alcohol Use  . 0.0 oz/week  . 0 Not  specified per week    Family History  Problem Relation Age of Onset  . Hypertension Mother     Reviw of Systems:  Reviewed in the HPI.  All other systems are negative.  Physical Exam: Blood pressure 120/90, pulse 60, height 5\' 11"  (1.803 m), weight 230 lb (104.327 kg). General: Well developed, well nourished, in no acute distress.  Head: Normocephalic, atraumatic, sclera non-icteric, mucus membranes are moist,   Neck: Supple. Carotids are 2 + without bruits. No JVD  Lungs: Clear bilaterally to auscultation.  Heart: regular rate  With normal  S1 S2. No murmurs, gallops or rubs.  Abdomen: Soft, non-tender, non-distended with normal bowel sounds. No hepatomegaly. No rebound/guarding. No masses.  Msk:  Strength and tone are normal  Extremities: No clubbing or cyanosis. No edema.  Distal pedal pulses are 2+ and equal bilaterally.  Neuro: Alert and oriented X 3. Moves all extremities spontaneously.  Psych:  Responds to questions appropriately with a normal affect.  ECG: Dec. 3, 2015:  NSR at 60,    Normal ECG   Assessment / Plan:

## 2014-02-15 ENCOUNTER — Other Ambulatory Visit (INDEPENDENT_AMBULATORY_CARE_PROVIDER_SITE_OTHER): Payer: BC Managed Care – PPO | Admitting: *Deleted

## 2014-02-15 DIAGNOSIS — E785 Hyperlipidemia, unspecified: Secondary | ICD-10-CM

## 2014-02-15 DIAGNOSIS — I10 Essential (primary) hypertension: Secondary | ICD-10-CM

## 2014-02-17 LAB — BASIC METABOLIC PANEL
BUN: 19 mg/dL (ref 6–23)
CO2: 27 mEq/L (ref 19–32)
Calcium: 9.4 mg/dL (ref 8.4–10.5)
Chloride: 102 mEq/L (ref 96–112)
Creatinine, Ser: 1.1 mg/dL (ref 0.4–1.5)
GFR: 77.24 mL/min (ref >60.00)
Glucose, Bld: 99 mg/dL (ref 70–99)
Potassium: 4.8 mEq/L (ref 3.5–5.1)
Sodium: 137 mEq/L (ref 135–145)

## 2014-02-17 LAB — LDL CHOLESTEROL, DIRECT: Direct LDL: 126.9 mg/dL

## 2014-02-17 LAB — LIPID PANEL
Cholesterol: 227 mg/dL — ABNORMAL HIGH (ref 0–200)
HDL: 31.2 mg/dL — ABNORMAL LOW (ref >39.00)
NonHDL: 195.8
Total CHOL/HDL Ratio: 7
Triglycerides: 256 mg/dL — ABNORMAL HIGH (ref 0.0–149.0)
VLDL: 51.2 mg/dL — ABNORMAL HIGH (ref 0.0–40.0)

## 2014-02-17 LAB — HEPATIC FUNCTION PANEL
ALT: 67 U/L — ABNORMAL HIGH (ref 0–53)
AST: 38 U/L — ABNORMAL HIGH (ref 0–37)
Albumin: 4.5 g/dL (ref 3.5–5.2)
Alkaline Phosphatase: 35 U/L — ABNORMAL LOW (ref 39–117)
Bilirubin, Direct: 0.1 mg/dL (ref 0.0–0.3)
Total Bilirubin: 0.7 mg/dL (ref 0.2–1.2)
Total Protein: 7.1 g/dL (ref 6.0–8.3)

## 2014-02-19 ENCOUNTER — Telehealth: Payer: Self-pay | Admitting: Nurse Practitioner

## 2014-02-19 DIAGNOSIS — E785 Hyperlipidemia, unspecified: Secondary | ICD-10-CM

## 2014-02-19 MED ORDER — ATORVASTATIN CALCIUM 40 MG PO TABS: 40.0000 mg | ORAL_TABLET | Freq: Every day | ORAL | Status: DC

## 2014-02-19 NOTE — Telephone Encounter (Signed)
-----   Message from Thayer Headings, MD sent at 02/19/2014  1:40 PM EST ----- His lipids are very elevated.  Would he be willing to try atorvastatin 40 a day. Needs to work on a better diet and exercise program.

## 2014-02-19 NOTE — Telephone Encounter (Signed)
Spoke with patient and reviewed lab results and Dr. Elmarie Shiley recommendation to start Atorvastatin 40 mg once daily and to work on a better diet and exercise program.  I reviewed with him a low carbohydrate, low sodium, high protein and vegetable diet.  I advised that we will need to recheck fasting labs in 3-6 months and patient requests an early June appointment.  Patient scheduled for 08/14/14 and orders in epic; Rx to patient's pharmacy.  Patient verbalized understanding and agreement with plan of care.

## 2014-08-14 ENCOUNTER — Other Ambulatory Visit (INDEPENDENT_AMBULATORY_CARE_PROVIDER_SITE_OTHER): Payer: Self-pay

## 2014-08-14 DIAGNOSIS — E785 Hyperlipidemia, unspecified: Secondary | ICD-10-CM

## 2014-08-14 NOTE — Addendum Note (Signed)
Addended by: Leanord Asal T on: 08/14/2014 08:04 AM   Modules accepted: Orders

## 2014-09-11 ENCOUNTER — Telehealth: Payer: Self-pay | Admitting: Nurse Practitioner

## 2014-09-11 NOTE — Telephone Encounter (Signed)
Spoke with patient and advised him that he was due for repeat fasting labs on June 1 and that blood work was not done.  Patient states he does not think that he is currently taking Atorvastatin 40 mg as prescribed in December by Dr. Acie Fredrickson.  I advised that repeat lab work is not needed if he is not taking the Atorvastatin.  I advised him to look for Rx at home and call me if he needs to have it resent (original sent 02/14/14).  I advised that lab work will need to be completed 3 months after starting medication.  Patient verbalized understanding and agreement.

## 2014-09-30 ENCOUNTER — Telehealth: Payer: Self-pay | Admitting: Pulmonary Disease

## 2014-09-30 NOTE — Telephone Encounter (Signed)
lmtcb x1 for pt. 

## 2014-09-30 NOTE — Telephone Encounter (Signed)
lmtcb x2 for pt. 

## 2014-09-30 NOTE — Telephone Encounter (Signed)
Pt returned call - [phone]

## 2014-10-01 ENCOUNTER — Other Ambulatory Visit: Payer: Self-pay | Admitting: *Deleted

## 2014-10-01 MED ORDER — ATORVASTATIN CALCIUM 40 MG PO TABS: 40.0000 mg | ORAL_TABLET | Freq: Every day | ORAL | Status: DC

## 2014-10-01 NOTE — Telephone Encounter (Signed)
Patient called and stated that after speaking with Christen Bame, he checked with [facility] and they were unable to locate the rx for atorvastatin due to purging their system. I will send in a new rx for him and I made him aware that he needs to return for lab work in three months after starting this medication. He also wanted Dr Acie Fredrickson to recommend another pulmonologist as Dr Gwenette Greet is no longer with the practice.

## 2014-10-02 NOTE — Telephone Encounter (Signed)
OK to send in a new Rx for atorvastatin  There are several pulmonologist in the [facility]. Any of them would be fine. Thanks

## 2014-10-02 NOTE — Telephone Encounter (Signed)
Left message for patient that Rx for Atorvastatin 40 mg has been sent and to call back to schedule 3 month lab appointment

## 2014-12-12 ENCOUNTER — Other Ambulatory Visit: Payer: Self-pay | Admitting: Nurse Practitioner

## 2014-12-12 DIAGNOSIS — E785 Hyperlipidemia, unspecified: Secondary | ICD-10-CM

## 2014-12-12 MED ORDER — ATORVASTATIN CALCIUM 40 MG PO TABS: 40.0000 mg | ORAL_TABLET | Freq: Every day | ORAL | Status: DC

## 2014-12-12 MED ORDER — NIACIN ER (ANTIHYPERLIPIDEMIC) 1000 MG PO TBCR: 1000.0000 mg | EXTENDED_RELEASE_TABLET | Freq: Every day | ORAL | Status: DC

## 2014-12-24 ENCOUNTER — Other Ambulatory Visit (INDEPENDENT_AMBULATORY_CARE_PROVIDER_SITE_OTHER): Payer: BLUE CROSS/BLUE SHIELD | Admitting: *Deleted

## 2014-12-24 DIAGNOSIS — E785 Hyperlipidemia, unspecified: Secondary | ICD-10-CM | POA: Diagnosis not present

## 2014-12-24 DIAGNOSIS — I10 Essential (primary) hypertension: Secondary | ICD-10-CM

## 2014-12-24 LAB — LIPID PANEL
Cholesterol: 142 mg/dL (ref 125–200)
HDL: 28 mg/dL — ABNORMAL LOW (ref >=40)
LDL Cholesterol: 68 mg/dL (ref <130)
Total CHOL/HDL Ratio: 5.1 Ratio — ABNORMAL HIGH (ref <=5.0)
Triglycerides: 232 mg/dL — ABNORMAL HIGH (ref <150)
VLDL: 46 mg/dL — ABNORMAL HIGH (ref <30)

## 2014-12-24 LAB — BASIC METABOLIC PANEL
BUN: 17 mg/dL (ref 7–25)
CO2: 26 mmol/L (ref 20–31)
Calcium: 9.5 mg/dL (ref 8.6–10.3)
Chloride: 101 mmol/L (ref 98–110)
Creat: 0.87 mg/dL (ref 0.60–1.35)
Glucose, Bld: 102 mg/dL — ABNORMAL HIGH (ref 65–99)
Potassium: 4.4 mmol/L (ref 3.5–5.3)
Sodium: 137 mmol/L (ref 135–146)

## 2014-12-24 LAB — HEPATIC FUNCTION PANEL
ALT: 92 U/L — ABNORMAL HIGH (ref 9–46)
AST: 51 U/L — ABNORMAL HIGH (ref 10–40)
Albumin: 4.2 g/dL (ref 3.6–5.1)
Alkaline Phosphatase: 40 U/L (ref 40–115)
Bilirubin, Direct: 0.1 mg/dL (ref <=0.2)
Indirect Bilirubin: 0.6 mg/dL (ref 0.2–1.2)
Total Bilirubin: 0.7 mg/dL (ref 0.2–1.2)
Total Protein: 6.8 g/dL (ref 6.1–8.1)

## 2015-02-13 ENCOUNTER — Encounter: Payer: Self-pay | Admitting: Cardiovascular Disease

## 2015-02-13 ENCOUNTER — Ambulatory Visit (INDEPENDENT_AMBULATORY_CARE_PROVIDER_SITE_OTHER): Payer: BLUE CROSS/BLUE SHIELD | Admitting: Cardiovascular Disease

## 2015-02-13 VITALS — BP 130/72 | HR 60 | Ht 71.0 in | Wt 241.8 lb

## 2015-02-13 DIAGNOSIS — I10 Essential (primary) hypertension: Secondary | ICD-10-CM

## 2015-02-13 DIAGNOSIS — E785 Hyperlipidemia, unspecified: Secondary | ICD-10-CM

## 2015-02-13 MED ORDER — LISINOPRIL 20 MG PO TABS: ORAL_TABLET | ORAL | Status: DC

## 2015-02-13 MED ORDER — FENOFIBRATE 160 MG PO TABS: ORAL_TABLET | ORAL | Status: DC

## 2015-02-13 NOTE — Progress Notes (Signed)
Tyee Hoggard Rullo Date of Birth  05-24-1965 [place] [address]    [place]   [place], [place]  [address]    [place], [place]  [address] [phone]  Fax  [phone]  [phone]  Fax [phone]  Problems: 1. Hypercholesterolemia 2. Hypertension 3. Obstructive sleep apnea  History of Present Illness:  Dejon has done fairly well since I last saw him. He is trying to eat a little bit better. He's not had any episodes of chest pain or shortness breath.  02/12/2013  Sahibjot is doing well.  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  No Cp , no dyspnea.  Dec. 3, 2015:  Audy is seen for his hyperlipidemia and HTN.  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  Dec. 1, 2016: Doing well. No CP or dyspnea.  Wt Readings from Last 3 Encounters:  02/13/15 241 lb 12.8 oz (109.68 kg)  02/14/14 230 lb (104.327 kg)  02/13/14 234 lb 12.8 oz (106.505 kg)   Has gained a bit of weight/     Current Outpatient Prescriptions on File Prior to Visit  Medication Sig Dispense Refill  . ASPIRIN PO Take 81 mg by mouth daily.     Marland Kitchen atorvastatin (LIPITOR) 40 MG tablet Take 1 tablet (40 mg total) by mouth daily. 90 tablet 3  . fenofibrate 160 MG tablet TAKE ONE TABLET BY MOUTH ONCE DAILY 30 tablet 0  . lisinopril (PRINIVIL,ZESTRIL) 20 MG tablet TAKE ONE TABLET BY MOUTH ONCE DAILY 90 tablet 3  . niacin (NIASPAN) 1000 MG CR tablet Take 1 tablet (1,000 mg total) by mouth at bedtime. 90 tablet 3  . Omega-3 Fatty Acids (OMEGA 3 PO) Take 1 tablet by mouth 3 (three) times daily.      No current facility-administered medications on file prior to visit.    Allergies  Allergen Reactions  . Niacin And Related     Hives  . Penicillins     Causes Hives    Past Medical History  Diagnosis Date  . HTN (hypertension)   . Hypercholesterolemia     Past Surgical History  Procedure Laterality Date  . Finger surgery    .  Knee surgery      Broken Knee Cap  . Knee cartilage surgery      Right torn  . Cystectomy      Right leg     History  Smoking status  . Former Smoker -- 0.50 packs/day for 3 years  . Types: Cigarettes  . Quit date: 03/15/1990  Smokeless tobacco  . Not on file    History  Alcohol Use  . 0.0 oz/week  . 0 Standard drinks or equivalent per week    Family History  Problem Relation Age of Onset  . Hypertension Mother     Reviw of Systems:  Reviewed in the HPI.  All other systems are negative.  Physical Exam: Blood pressure 130/72, pulse 60, height 5\' 11"  (1.803 m), weight 241 lb 12.8 oz (109.68 kg). General: Well developed, well nourished, in no acute distress.  Head: Normocephalic, atraumatic, sclera non-icteric, mucus membranes are moist,   Neck: Supple. Carotids are 2 + without bruits. No JVD  Lungs: Clear bilaterally to auscultation.  Heart: regular rate  With normal  S1 S2. No murmurs, gallops or rubs.  Abdomen: Soft, non-tender, non-distended with normal bowel sounds. No hepatomegaly.  No rebound/guarding. No masses.  Msk:  Strength and tone are normal  Extremities: No clubbing or cyanosis. No edema.  Distal pedal pulses are 2+ and equal bilaterally.  Neuro: Alert and oriented X 3. Moves all extremities spontaneously.  Psych:  Responds to questions appropriately with a normal affect.  ECG: Feb 13, 2015:    NSR at 60.  Normal ECG  Assessment / Plan:   1. Hypercholesterolemia- cholesterol levels  Are good.  Trigs are mildly elevated.   Ran out of his fenofibrate.  Have refilled.  Needs to work on weight loss. We discussed the relatively neutral effect of Niacin and I suggested that he could stop it if he wanted to   2. Hypertension - continue Lisinopril.  BP is well controlled.     3. Obstructive sleep apnea - per medical doctor      Kamyla Olejnik, Wonda Cheng, MD  02/13/2015 8:32 AM    [address] [place],  [place], [place]  [address] Pager [phone] Phone: [phone]; Fax: [phone]   [facility]  [address] [place], [place]  [address] [phone]   Fax [phone]

## 2015-02-13 NOTE — Patient Instructions (Addendum)
Medication Instructions:  No medication changes today  Labwork: Your physician recommends that you return for a FASTING lipid profile/liver profile/BMET in 1 year.   Testing/Procedures: None today  Follow-Up: Your physician wants you to follow-up in: 1 year with Dr Acie Fredrickson. (December  2017). You will receive a reminder letter in the mail two months in advance. If you don't receive a letter, please call our office to schedule the follow-up appointment.        If you need a refill on your cardiac medications before your next appointment, please call your pharmacy.

## 2015-05-09 ENCOUNTER — Ambulatory Visit: Payer: BLUE CROSS/BLUE SHIELD | Admitting: Internal Medicine

## 2015-08-13 DIAGNOSIS — I10 Essential (primary) hypertension: Secondary | ICD-10-CM | POA: Diagnosis not present

## 2015-08-13 DIAGNOSIS — E7800 Pure hypercholesterolemia, unspecified: Secondary | ICD-10-CM | POA: Diagnosis not present

## 2015-08-13 DIAGNOSIS — Z713 Dietary counseling and surveillance: Secondary | ICD-10-CM | POA: Diagnosis not present

## 2015-08-13 DIAGNOSIS — E669 Obesity, unspecified: Secondary | ICD-10-CM | POA: Diagnosis not present

## 2015-08-18 ENCOUNTER — Encounter: Payer: Self-pay | Admitting: Internal Medicine

## 2015-08-18 ENCOUNTER — Ambulatory Visit (INDEPENDENT_AMBULATORY_CARE_PROVIDER_SITE_OTHER): Payer: BLUE CROSS/BLUE SHIELD | Admitting: Internal Medicine

## 2015-08-18 VITALS — BP 124/80 | HR 60 | Ht 71.0 in | Wt 242.0 lb

## 2015-08-18 DIAGNOSIS — G4733 Obstructive sleep apnea (adult) (pediatric): Secondary | ICD-10-CM

## 2015-08-18 NOTE — Patient Instructions (Addendum)
Order- [facility]  Continue CPAP auto 5-15, mask of choice, humidifier, supplies, please add AirView  You can explore portable CPAP machines like Transcend  Order- referral to orthodontist Dr Oneal Grout    Consider oral appliance for OSA as a supplemental tool while camping, etc.

## 2015-08-18 NOTE — Progress Notes (Signed)
   Subjective:    Patient ID: Jacob Aguirre, male    DOB: 03/18/1965, 50 y.o.   MRN: [id number]  HPI  02/13/14- Dr Gwenette Greet The patient comes in today for follow-up of his known obstructive sleep apnea. He tells me that he is wearing his device compliantly, and is very satisfied with his sleep and daytime alertness. He is not having any mask leak issues, or problems with the pressure. Note, the patient's weight is stable from the last visit.  08/18/2015-50 year old male followed for OSA NPSG 05/2011   AHI 56/ hr,    HST 2014  AHI 40/ hr CPAP auto 5-15/ [facility]  Former [facility] patient; [facility]. Pt wears CPAP nightly. Not in [place] and no recent DL. Environment-rotating call schedule with potential disrupted sleep. He has been using a borrowed nasal pillows CPAP mask. Clearly sleeps better with CPAP but interested in alternatives.  Review of Systems  Constitutional: Negative for fever and unexpected weight change.  HENT: Negative for congestion, dental problem, ear pain, nosebleeds, postnasal drip, rhinorrhea, sinus pressure, sneezing, sore throat and trouble swallowing.   Eyes: Negative for redness and itching.  Respiratory: Negative for cough, chest tightness, shortness of breath and wheezing.   Cardiovascular: Negative for palpitations and leg swelling.  Gastrointestinal: Negative for nausea and vomiting.  Genitourinary: Negative for dysuria.  Musculoskeletal: Negative for joint swelling.  Skin: Negative for rash.  Neurological: Negative for headaches.  Hematological: Does not bruise/bleed easily.  Psychiatric/Behavioral: Negative for dysphoric mood. The patient is not nervous/anxious.      Objective:  OBJ- Physical Exam General- Alert, Oriented, Affect-appropriate, Distress- none acute Skin- rash-none, lesions- none, excoriation- none Lymphadenopathy- none Head- atraumatic            Eyes- Gross vision intact, PERRLA, conjunctivae and secretions clear     Ears- Hearing, canals-normal            Nose- Clear, no-Septal dev, mucus, polyps, erosion, perforation             Throat- Mallampati III , mucosa clear , drainage- none, tonsils- atrophic Neck- flexible , trachea midline, no stridor , thyroid nl, carotid no bruit Chest - symmetrical excursion , unlabored           Heart/CV- RRR , no murmur , no gallop  , no rub, nl s1 s2                           - JVD- none , edema- none, stasis changes- none, varices- none           Lung- clear to P&A, wheeze- none, cough- none , dullness-none, rub- none           Chest wall-  Abd-  Br/ Gen/ Rectal- Not done, not indicated Extrem- cyanosis- none, clubbing, none, atrophy- none, strength- nl Neuro- grossly intact to observation    Assessment & Plan:

## 2015-08-19 NOTE — Assessment & Plan Note (Addendum)
30 minutes was taken reviewing his sleep apnea diagnostic status, management and treatment options, answering questions.  We will update his CPAP orders through [facility], adding [employer]. 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

## 2015-08-26 DIAGNOSIS — G4733 Obstructive sleep apnea (adult) (pediatric): Secondary | ICD-10-CM | POA: Diagnosis not present

## 2015-12-17 DIAGNOSIS — Z23 Encounter for immunization: Secondary | ICD-10-CM | POA: Diagnosis not present

## 2016-04-05 ENCOUNTER — Telehealth: Payer: Self-pay | Admitting: Cardiovascular Disease

## 2016-04-05 ENCOUNTER — Other Ambulatory Visit: Payer: Self-pay | Admitting: *Deleted

## 2016-04-05 DIAGNOSIS — I10 Essential (primary) hypertension: Secondary | ICD-10-CM

## 2016-04-05 MED ORDER — ATORVASTATIN CALCIUM 40 MG PO TABS: 40.0000 mg | ORAL_TABLET | Freq: Every day | ORAL | 0 refills | Status: DC

## 2016-04-05 MED ORDER — LISINOPRIL 20 MG PO TABS: ORAL_TABLET | ORAL | 0 refills | Status: DC

## 2016-04-05 NOTE — Telephone Encounter (Signed)
New message       *STAT* If patient is at the pharmacy, call can be transferred to refill team.   1. Which medications need to be refilled? (please list name of each medication and dose if known)  Lisinopril 20mg , atorvastatin 40mg  2. Which pharmacy/location (including street and city if local pharmacy) is medication to be sent to? [facility] [address]  3. Do they need a 30 day or 90 day supply? 90 day

## 2016-04-06 ENCOUNTER — Ambulatory Visit: Payer: BLUE CROSS/BLUE SHIELD | Admitting: Cardiovascular Disease

## 2016-04-21 DIAGNOSIS — Z713 Dietary counseling and surveillance: Secondary | ICD-10-CM | POA: Diagnosis not present

## 2016-04-21 DIAGNOSIS — E7800 Pure hypercholesterolemia, unspecified: Secondary | ICD-10-CM | POA: Diagnosis not present

## 2016-04-21 DIAGNOSIS — E669 Obesity, unspecified: Secondary | ICD-10-CM | POA: Diagnosis not present

## 2016-04-21 DIAGNOSIS — I10 Essential (primary) hypertension: Secondary | ICD-10-CM | POA: Diagnosis not present

## 2016-05-20 ENCOUNTER — Encounter: Payer: Self-pay | Admitting: *Deleted

## 2016-05-27 ENCOUNTER — Encounter (INDEPENDENT_AMBULATORY_CARE_PROVIDER_SITE_OTHER): Payer: Self-pay

## 2016-05-27 ENCOUNTER — Ambulatory Visit (INDEPENDENT_AMBULATORY_CARE_PROVIDER_SITE_OTHER): Payer: BLUE CROSS/BLUE SHIELD | Admitting: Cardiovascular Disease

## 2016-05-27 ENCOUNTER — Encounter: Payer: Self-pay | Admitting: Cardiovascular Disease

## 2016-05-27 VITALS — BP 126/90 | HR 64 | Ht 71.0 in | Wt 244.0 lb

## 2016-05-27 DIAGNOSIS — E782 Mixed hyperlipidemia: Secondary | ICD-10-CM

## 2016-05-27 DIAGNOSIS — I10 Essential (primary) hypertension: Secondary | ICD-10-CM

## 2016-05-27 LAB — LIPID PANEL
Chol/HDL Ratio: 5 ratio units (ref 0.0–5.0)
Cholesterol, Total: 145 mg/dL (ref 100–199)
HDL: 29 mg/dL — ABNORMAL LOW (ref >39)
LDL Calculated: 70 mg/dL (ref 0–99)
Triglycerides: 229 mg/dL — ABNORMAL HIGH (ref 0–149)
VLDL Cholesterol Cal: 46 mg/dL — ABNORMAL HIGH (ref 5–40)

## 2016-05-27 LAB — COMPREHENSIVE METABOLIC PANEL
ALT: 60 IU/L — ABNORMAL HIGH (ref 0–44)
AST: 35 IU/L (ref 0–40)
Albumin/Globulin Ratio: 1.8 (ref 1.2–2.2)
Albumin: 4.6 g/dL (ref 3.5–5.5)
Alkaline Phosphatase: 51 IU/L (ref 39–117)
BUN/Creatinine Ratio: 21 — ABNORMAL HIGH (ref 9–20)
BUN: 20 mg/dL (ref 6–24)
Bilirubin Total: 0.8 mg/dL (ref 0.0–1.2)
CO2: 25 mmol/L (ref 18–29)
Calcium: 9.9 mg/dL (ref 8.7–10.2)
Chloride: 98 mmol/L (ref 96–106)
Creatinine, Ser: 0.95 mg/dL (ref 0.76–1.27)
GFR calc Af Amer: 109 mL/min/{1.73_m2} (ref >59)
GFR calc non Af Amer: 94 mL/min/{1.73_m2} (ref >59)
Globulin, Total: 2.5 g/dL (ref 1.5–4.5)
Glucose: 92 mg/dL (ref 65–99)
Potassium: 5.3 mmol/L — ABNORMAL HIGH (ref 3.5–5.2)
Sodium: 139 mmol/L (ref 134–144)
Total Protein: 7.1 g/dL (ref 6.0–8.5)

## 2016-05-27 NOTE — Patient Instructions (Signed)
Medication Instructions:  Your physician recommends that you continue on your current medications as directed. Please refer to the Current Medication list given to you today.   Labwork: TODAY - cholesterol, complete metabolic panel   Testing/Procedures: None Ordered   Follow-Up: Your physician wants you to follow-up in: 1 year with Dr. Acie Fredrickson.  You will receive a reminder letter in the mail two months in advance. If you don't receive a letter, please call our office to schedule the follow-up appointment.   If you need a refill on your cardiac medications before your next appointment, please call your pharmacy.   Thank you for choosing [facility]! Christen Bame, RN [phone]

## 2016-05-27 NOTE — Progress Notes (Signed)
Jacob Aguirre Date of Birth  Sep 09, 1965 [place] [address]    [place]   [place], [place]  [address]    [place], [place]  [address] [phone]  Fax  [phone]  [phone]  Fax [phone]  Problems: 1. Hypercholesterolemia 2. Hypertension 3. Obstructive sleep apnea  History of Present Illness:  Jacob Aguirre has done fairly well since I last saw him. He is trying to eat a little bit better. He's not had any episodes of chest pain or shortness breath.  02/12/2013  Jacob Aguirre is doing well.  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  No Cp , no dyspnea.  Dec. 3, 2015:  Jacob Aguirre is seen for his hyperlipidemia and HTN.  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  Dec. 1, 2016: Doing well. No CP or dyspnea.  Wt Readings from Last 3 Encounters:  02/13/15 241 lb 12.8 oz (109.68 kg)  02/14/14 230 lb (104.327 kg)  02/13/14 234 lb 12.8 oz (106.505 kg)   Has gained a bit of weight/  May 27, 2016:  Doing well.  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    Current Outpatient Prescriptions on File Prior to Visit  Medication Sig Dispense Refill  . atorvastatin (LIPITOR) 40 MG tablet Take 1 tablet (40 mg total) by mouth daily. 90 tablet 0  . lisinopril (PRINIVIL,ZESTRIL) 20 MG tablet TAKE ONE TABLET BY MOUTH ONCE DAILY 90 tablet 0  . Omega-3 Fatty Acids (OMEGA 3 PO) Take 1 tablet by mouth 3 (three) times daily.      No current facility-administered medications on file prior to visit.     Allergies  Allergen Reactions  . Niacin And Related     Hives  . Penicillins     Causes Hives    Past Medical History:  Diagnosis Date  . HTN (hypertension)   . Hypercholesterolemia     Past Surgical History:  Procedure Laterality Date  . CYSTECTOMY     Right leg   . FINGER SURGERY    . KNEE CARTILAGE SURGERY     Right torn  . KNEE SURGERY     Broken Knee Cap    History  Smoking Status  . Former Smoker  . Packs/day: 0.50  . Years: 3.00  . Types: Cigarettes  . Quit date: 03/15/1990  Smokeless Tobacco  . Never Used    History  Alcohol Use  . 0.0 oz/week    Family History  Problem Relation Age of Onset  . Heart murmur Father   . Hypertension Mother     Reviw of Systems:  Reviewed in the HPI.  All other systems are negative.  Physical Exam: Blood pressure 126/90, pulse 64, height 5\' 11"  (1.803 m), weight 244 lb (110.7 kg), SpO2 97 %. General: Well developed, well nourished, in no acute distress.  Head: Normocephalic, atraumatic, sclera non-icteric, mucus membranes are moist,   Neck: Supple. Carotids are 2 + without bruits. No JVD  Lungs: Clear bilaterally to auscultation.  Heart: regular rate  With normal  S1 S2. No murmurs, gallops or rubs.  Abdomen: Soft, non-tender, non-distended with normal bowel sounds. No hepatomegaly. No rebound/guarding. No masses.  Msk:  Strength and tone are normal  Extremities: No clubbing or cyanosis. No edema.  Distal pedal pulses are  2+ and equal bilaterally.  Neuro: Alert and oriented X 3. Moves all extremities spontaneously.  Psych:  Responds to questions appropriately with a normal affect.  ECG: May 27, 2016:   NSR at 37.   Normal ECG   Assessment / Plan:   1. Hypercholesterolemia- cholesterol levels  Have been good.  Will check labs today .   Needs to work on weight loss. We discussed the relatively neutral effect of Niacin and I suggested that he could stop it if he wanted to   2. Hypertension - continue Lisinopril.  BP is well controlled.     3. Obstructive sleep apnea - per medical doctor      Mertie Moores, MD  05/27/2016 11:36 AM    [address],  [place], [place]  [address] Pager [phone] Phone: [phone]; Fax: [phone]

## 2016-07-14 DIAGNOSIS — E7800 Pure hypercholesterolemia, unspecified: Secondary | ICD-10-CM | POA: Diagnosis not present

## 2016-07-14 DIAGNOSIS — Z713 Dietary counseling and surveillance: Secondary | ICD-10-CM | POA: Diagnosis not present

## 2016-07-14 DIAGNOSIS — I10 Essential (primary) hypertension: Secondary | ICD-10-CM | POA: Diagnosis not present

## 2016-07-14 DIAGNOSIS — E669 Obesity, unspecified: Secondary | ICD-10-CM | POA: Diagnosis not present

## 2016-07-19 ENCOUNTER — Other Ambulatory Visit: Payer: Self-pay | Admitting: Cardiovascular Disease

## 2016-07-19 DIAGNOSIS — I10 Essential (primary) hypertension: Secondary | ICD-10-CM

## 2016-09-21 DIAGNOSIS — Z713 Dietary counseling and surveillance: Secondary | ICD-10-CM | POA: Diagnosis not present

## 2016-09-21 DIAGNOSIS — E7800 Pure hypercholesterolemia, unspecified: Secondary | ICD-10-CM | POA: Diagnosis not present

## 2016-09-21 DIAGNOSIS — I10 Essential (primary) hypertension: Secondary | ICD-10-CM | POA: Diagnosis not present

## 2016-09-21 DIAGNOSIS — E669 Obesity, unspecified: Secondary | ICD-10-CM | POA: Diagnosis not present

## 2017-02-07 DIAGNOSIS — M79674 Pain in right toe(s): Secondary | ICD-10-CM | POA: Diagnosis not present

## 2017-02-07 DIAGNOSIS — M109 Gout, unspecified: Secondary | ICD-10-CM | POA: Diagnosis not present

## 2017-03-24 DIAGNOSIS — Z713 Dietary counseling and surveillance: Secondary | ICD-10-CM | POA: Diagnosis not present

## 2017-03-24 DIAGNOSIS — E7800 Pure hypercholesterolemia, unspecified: Secondary | ICD-10-CM | POA: Diagnosis not present

## 2017-03-24 DIAGNOSIS — I10 Essential (primary) hypertension: Secondary | ICD-10-CM | POA: Diagnosis not present

## 2017-03-24 DIAGNOSIS — E669 Obesity, unspecified: Secondary | ICD-10-CM | POA: Diagnosis not present

## 2017-05-17 DIAGNOSIS — E669 Obesity, unspecified: Secondary | ICD-10-CM | POA: Diagnosis not present

## 2017-05-17 DIAGNOSIS — E7800 Pure hypercholesterolemia, unspecified: Secondary | ICD-10-CM | POA: Diagnosis not present

## 2017-05-17 DIAGNOSIS — Z713 Dietary counseling and surveillance: Secondary | ICD-10-CM | POA: Diagnosis not present

## 2017-05-17 DIAGNOSIS — I10 Essential (primary) hypertension: Secondary | ICD-10-CM | POA: Diagnosis not present

## 2017-05-30 DIAGNOSIS — H168 Other keratitis: Secondary | ICD-10-CM | POA: Diagnosis not present

## 2017-06-02 DIAGNOSIS — H168 Other keratitis: Secondary | ICD-10-CM | POA: Diagnosis not present

## 2017-07-08 DIAGNOSIS — M25561 Pain in right knee: Secondary | ICD-10-CM | POA: Diagnosis not present

## 2017-07-12 ENCOUNTER — Ambulatory Visit: Payer: BLUE CROSS/BLUE SHIELD | Admitting: Family Medicine

## 2017-07-12 VITALS — BP 142/88 | HR 56 | Temp 97.3°F | Ht 70.55 in | Wt 241.0 lb

## 2017-07-12 DIAGNOSIS — G4733 Obstructive sleep apnea (adult) (pediatric): Secondary | ICD-10-CM | POA: Diagnosis not present

## 2017-07-12 DIAGNOSIS — Z7689 Persons encountering health services in other specified circumstances: Secondary | ICD-10-CM

## 2017-07-12 DIAGNOSIS — M25561 Pain in right knee: Secondary | ICD-10-CM

## 2017-07-12 DIAGNOSIS — E782 Mixed hyperlipidemia: Secondary | ICD-10-CM

## 2017-07-12 DIAGNOSIS — I10 Essential (primary) hypertension: Secondary | ICD-10-CM

## 2017-07-12 MED ORDER — DICLOFENAC SODIUM 75 MG PO TBEC: 75.0000 mg | DELAYED_RELEASE_TABLET | Freq: Two times a day (BID) | ORAL | 3 refills | Status: DC

## 2017-07-12 NOTE — Progress Notes (Signed)
Patient ID: Jacob Aguirre, male  DOB: 1965/11/09, 52 y.o.   MRN: [id number] Patient Care Team    Relationship Specialty Notifications Start End  Ma Hillock, DO PCP - General Family Medicine  07/12/17   Nahser, Wonda Cheng, MD  Cardiology  08/27/11   Deneise Lever, MD Consulting Physician Pulmonary Disease  07/12/17     Chief Complaint  Patient presents with  . Establish Care    Knee Pain X 3 weeks.    Subjective:  Jacob Aguirre is a 52 y.o.  male present for new patient establishment. All past medical history, surgical history, allergies, family history, immunizations, medications and social history were in the electronic medical record today. All recent labs, ED visits and hospitalizations within the last year were reviewed. 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   right knee pain: Patient reports increasing right knee pain greater than 1 week ago.  He has a history of Baker's cyst removal and an old meniscal injury in that knee.  Denies any acute injury that he is aware of initiated his discomfort.  He states he has pain with any type of movement.  He is concerned because he is a Airline pilot and if he cannot pass the physical as he cannot work in his position.  He was seen at an urgent care a few days ago for this condition, and started on Voltaren.  Patient reports he does see benefit with the Voltaren use, but the pain is still present.  Hypertension/HLD: follows with cardiology.   OSA: follows with pulmonology, wears a CPAP.  Depression screen [facility] 2/9 07/12/2017  Decreased Interest 0  Down, Depressed, Hopeless 0  PHQ - 2 Score 0   No flowsheet data found.   Current Exercise Habits: Structured exercise class;Home exercise routine, Type of exercise: strength training/weights;Other - see comments, Time (Minutes): 60, Frequency (Times/Week): 5, Weekly Exercise (Minutes/Week): 300, Intensity: Moderate   Fall Risk  07/12/2017    Falls in the past year? No     Immunization History  Administered Date(s) Administered  . Influenza Whole 12/14/2011  . Influenza,inj,Quad PF,6+ Mos 01/12/2013  . Influenza-Unspecified 12/13/2013  . Td 08/05/2003  . Tdap 09/15/2010    No exam data present  Past Medical History:  Diagnosis Date  . Gout   . HTN (hypertension)   . Hypercholesterolemia   . OSA (obstructive sleep apnea)    wears CPAP   . Tinea corporis    Allergies  Allergen Reactions  . Erythromycin   . Niacin And Related     Hives  . Penicillins     Causes Hives   Past Surgical History:  Procedure Laterality Date  . CYSTECTOMY     Right leg   . excision of baker's cyst    . FINGER SURGERY    . KNEE CARTILAGE SURGERY     Right torn  . KNEE SURGERY     Broken Knee Cap  . pilonidal cyst resection     Family History  Problem Relation Age of Onset  . Heart murmur Father   . Hypertension Mother    Social History   Socioeconomic History  . Marital status: Married    Spouse name: Not on file  . Number of children: Not on file  . Years of education: Not on file  . Highest education level: Not on file  Occupational History  . Not on file  Social Needs  .  Financial resource strain: Not on file  . Food insecurity:    Worry: Not on file    Inability: Not on file  . Transportation needs:    Medical: Not on file    Non-medical: Not on file  Tobacco Use  . Smoking status: Former Smoker    Packs/day: 0.50    Years: 3.00    Pack years: 1.50    Types: Cigarettes    Last attempt to quit: 03/15/1990    Years since quitting: 27.3  . Smokeless tobacco: Never Used  Substance and Sexual Activity  . Alcohol use: Yes    Alcohol/week: 0.0 oz  . Drug use: No  . Sexual activity: Not on file  Lifestyle  . Physical activity:    Days per week: Not on file    Minutes per session: Not on file  . Stress: Not on file  Relationships  . Social connections:    Talks on phone: Not on file    Gets  together: Not on file    Attends religious service: Not on file    Active member of club or organization: Not on file    Attends meetings of clubs or organizations: Not on file    Relationship status: Not on file  . Intimate partner violence:    Fear of current or ex partner: Not on file    Emotionally abused: Not on file    Physically abused: Not on file    Forced sexual activity: Not on file  Other Topics Concern  . Not on file  Social History Narrative  . Not on file   Allergies as of 07/12/2017      Reactions   Erythromycin    Niacin And Related    Hives   Penicillins    Causes Hives      Medication List        Accurate as of 07/12/17 10:29 AM. Always use your most recent med list.          atorvastatin 40 MG tablet Commonly known as:  LIPITOR TAKE ONE TABLET BY MOUTH ONCE DAILY   diclofenac 75 MG EC tablet Commonly known as:  VOLTAREN Take 1 tablet (75 mg total) by mouth 2 (two) times daily.   FLAXSEED OIL PO Take by mouth.   lisinopril 20 MG tablet Commonly known as:  PRINIVIL,ZESTRIL TAKE ONE TABLET BY MOUTH ONCE DAILY   OMEGA 3 PO Take 1 tablet by mouth 3 (three) times daily.       All past medical history, surgical history, allergies, family history, immunizations andmedications were updated in the EMR today and reviewed under the history and medication portions of their EMR.    No results found for this or any previous visit (from the past 2160 hour(s)).  No results found.   ROS: 14 pt review of systems performed and negative (unless mentioned in an HPI)  Objective: BP (!) 142/88 (BP Location: Left Arm, Patient Position: Sitting, Cuff Size: Large)   Pulse (!) 56   Temp (!) 97.3 F (36.3 C) (Oral)   Ht 5' 10.55" (1.792 m)   Wt 241 lb (109.3 kg)   SpO2 97%   BMI 34.04 kg/m  Gen: Afebrile. No acute distress. Nontoxic in appearance, well-developed, well-nourished, Caucasian, obese, male. HENT: AT. [place]. MMM Eyes:Pupils Equal Round Reactive to  light, Extraocular movements intact,  Conjunctiva without redness, discharge or icterus. CV: RRR no murmur, no edema, +2/4 P posterior tibialis pulses.  No carotid bruits. No JVD.  Chest: CTAB, no wheeze, rhonchi or crackles. Skin: no rashes, purpura or petechiae. Warm and well-perfused. Skin intact. Neuro/Msk: Mild limp. PERLA. EOMi. Alert. Oriented x3.  Right knee exam: No erythema, no soft tissue swelling, mild lateral joint line tenderness to palpation.  No ligament laxity.  Negative Lachman's.  But is president.  Audible click with extension.  Discomfort and mildly decreased range of motion in flexion.  Neurovascularly intact distally.  Psych: Normal affect, dress and demeanor. Normal speech. Normal thought content and judgment.  Assessment/plan: Jacob Aguirre is a 52 y.o. male present for establishment with acute issue.  Acute pain of right knee Discussed options with patient today.  He is a Airline pilot, and has concerns over passing the physical.  His exam is positive for possible meniscal injury.  Prefer to refer to orthopedics given the nature of his job requirements.  Continue diclofenac twice daily daily. - Ambulatory referral to Orthopedic Surgery   Morbid obesity (HCC)/Essential hypertension/Mixed hyperlipidemia -Managed by cardiology.  Continue lisinopril, Lipitor and daily ASA.  Patient to monitor blood pressure is mildly elevated today however he states he was rushed to get here on time and has had a bad day. -Request prior labs.  He has all physicals completed through his employer.  OSA (obstructive sleep apnea) -Versus CPAP.  Managed by pulmonology.    Return if symptoms worsen or fail to improve.   Note is dictated utilizing voice recognition software. Although note has been proof read prior to signing, occasional typographical errors still can be missed. If any questions arise, please do not hesitate to call for verification.  Electronically signed by: Howard Pouch,  DO [place]

## 2017-07-12 NOTE — Patient Instructions (Signed)
It was nice to meet you.  Continue the Voltaren.  They will call you to schedule an appt to follow up on your knee.   Please help [place] help you:  We are honored you have chosen [facility] for your Primary Care home. Below you will find basic instructions that you may need to access in the future. Please help [place] help you by reading the instructions, which cover many of the frequent questions we experience.   Prescription refills and request:  -In order to allow more efficient response time, please call your pharmacy for all refills. They will forward the request electronically to [place]. This allows for the quickest possible response. Request left on a nurse line can take longer to refill, since these are checked as time allows between office patients and other phone calls.  - refill request can take up to 3-5 working days to complete.  - If request is sent electronically and request is appropiate, it is usually completed in 1-2 business days.  - all patients will need to be seen routinely for all chronic medical conditions requiring prescription medications (see follow-up below). If you are overdue for follow up on your condition, you will be asked to make an appointment and we will call in enough medication to cover you until your appointment (up to 30 days).  - all controlled substances will require a face to face visit to request/refill.  - if you desire your prescriptions to go through a new pharmacy, and have an active script at original pharmacy, you will need to call your pharmacy and have scripts transferred to new pharmacy. This is completed between the pharmacy locations and not by your provider.    Results: If any images or labs were ordered, it can take up to 1 week to get results depending on the test ordered and the lab/facility running and resulting the test. - Normal or stable results, which do not need further discussion, may be released to your mychart immediately with attached  note to you. A call may not be generated for normal results. Please make certain to sign up for mychart. If you have questions on how to activate your mychart you can call the front office.  - If your results need further discussion, our office will attempt to contact you via phone, and if unable to reach you after 2 attempts, we will release your abnormal result to your mychart with instructions.  - All results will be automatically released in mychart after 1 week.  - Your provider will provide you with explanation and instruction on all relevant material in your results. Please keep in mind, results and labs may appear confusing or abnormal to the untrained eye, but it does not mean they are actually abnormal for you personally. If you have any questions about your results that are not covered, or you desire more detailed explanation than what was provided, you should make an appointment with your provider to do so.   Our office handles many outgoing and incoming calls daily. If we have not contacted you within 1 week about your results, please check your mychart to see if there is a message first and if not, then contact our office.  In helping with this matter, you help decrease call volume, and therefore allow [place] to be able to respond to patients needs more efficiently.   Acute office visits (sick visit):  An acute visit is intended for a new problem and are scheduled in shorter  time slots to allow schedule openings for patients with new problems. This is the appropriate visit to discuss a new problem. In order to provide you with excellent quality medical care with proper time for you to explain your problem, have an exam and receive treatment with instructions, these appointments should be limited to one new problem per visit. If you experience a new problem, in which you desire to be addressed, please make an acute office visit, we save openings on the schedule to accommodate you. Please do not save  your new problem for any other type of visit, let us take care of it properly and quickly for you.   Follow up visits:  Depending on your condition(s) your provider will need to see you routinely in order to provide you with quality care and prescribe medication(s). Most chronic conditions (Example: hypertension, Diabetes, depression/anxiety... etc), require visits a couple times a year. Your provider will instruct you on proper follow up for your personal medical conditions and history. Please make certain to make follow up appointments for your condition as instructed. Failing to do so could result in lapse in your medication treatment/refills. If you request a refill, and are overdue to be seen on a condition, we will always provide you with a 30 day script (once) to allow you time to schedule.    Medicare wellness (well visit): - we have a wonderful Nurse Maudie Mercury), that will meet with you and provide you will yearly medicare wellness visits. These visits should occur yearly (can not be scheduled less than 1 calendar year apart) and cover preventive health, immunizations, advance directives and screenings you are entitled to yearly through your medicare benefits. Do not miss out on your entitled benefits, this is when medicare will pay for these benefits to be ordered for you.  These are strongly encouraged by your provider and is the appropriate type of visit to make certain you are up to date with all preventive health benefits. If you have not had your medicare wellness exam in the last 12 months, please make certain to schedule one by calling the office and schedule your medicare wellness with Maudie Mercury as soon as possible.   Yearly physical (well visit):  - Adults are recommended to be seen yearly for physicals. Check with your insurance and date of your last physical, most insurances require one calendar year between physicals. Physicals include all preventive health topics, screenings, medical exam and  labs that are appropriate for gender/age and history. You may have fasting labs needed at this visit. This is a well visit (not a sick visit), new problems should not be covered during this visit (see acute visit).  - Pediatric patients are seen more frequently when they are younger. Your provider will advise you on well child visit timing that is appropriate for your their age. - This is not a medicare wellness visit. Medicare wellness exams do not have an exam portion to the visit. Some medicare companies allow for a physical, some do not allow a yearly physical. If your medicare allows a yearly physical you can schedule the medicare wellness with our nurse Maudie Mercury and have your physical with your provider after, on the same day. Please check with insurance for your full benefits.   Late Policy/No Shows:  - all new patients should arrive 15-30 minutes earlier than appointment to allow [place] time  to  obtain all personal demographics,  insurance information and for you to complete office paperwork. - All established patients should  arrive 10-15 minutes earlier than appointment time to update all information and be checked in .  - In our best efforts to run on time, if you are late for your appointment you will be asked to either reschedule or if able, we will work you back into the schedule. There will be a wait time to work you back in the schedule,  depending on availability.  - If you are unable to make it to your appointment as scheduled, please call 24 hours ahead of time to allow [place] to fill the time slot with someone else who needs to be seen. If you do not cancel your appointment ahead of time, you may be charged a no show fee.

## 2017-07-13 DIAGNOSIS — Z713 Dietary counseling and surveillance: Secondary | ICD-10-CM | POA: Diagnosis not present

## 2017-07-13 DIAGNOSIS — E669 Obesity, unspecified: Secondary | ICD-10-CM | POA: Diagnosis not present

## 2017-07-13 DIAGNOSIS — I10 Essential (primary) hypertension: Secondary | ICD-10-CM | POA: Diagnosis not present

## 2017-07-13 DIAGNOSIS — E7800 Pure hypercholesterolemia, unspecified: Secondary | ICD-10-CM | POA: Diagnosis not present

## 2017-07-14 ENCOUNTER — Ambulatory Visit (INDEPENDENT_AMBULATORY_CARE_PROVIDER_SITE_OTHER): Payer: BLUE CROSS/BLUE SHIELD | Admitting: Orthopedic Surgery

## 2017-07-14 ENCOUNTER — Ambulatory Visit (INDEPENDENT_AMBULATORY_CARE_PROVIDER_SITE_OTHER): Payer: BLUE CROSS/BLUE SHIELD

## 2017-07-14 ENCOUNTER — Encounter (INDEPENDENT_AMBULATORY_CARE_PROVIDER_SITE_OTHER): Payer: Self-pay | Admitting: Orthopedic Surgery

## 2017-07-14 VITALS — Ht 71.0 in | Wt 235.0 lb

## 2017-07-14 DIAGNOSIS — M25561 Pain in right knee: Secondary | ICD-10-CM

## 2017-07-14 MED ORDER — LIDOCAINE HCL 1 % IJ SOLN
5.0000 mL | INTRAMUSCULAR | Status: AC | PRN
  Administered 2017-07-14: 5 mL

## 2017-07-14 MED ORDER — METHYLPREDNISOLONE ACETATE 40 MG/ML IJ SUSP
40.0000 mg | INTRAMUSCULAR | Status: AC | PRN
  Administered 2017-07-14: 40 mg via INTRA_ARTICULAR

## 2017-07-14 NOTE — Progress Notes (Signed)
Office Visit Note   Patient: Jacob Aguirre           Date of Birth: [birth year]           MRN: [id number] Visit Date: 07/14/2017              Requested by: Ma Hillock, DO [address] [place], [place] [address] PCP: Ma Hillock, DO  Chief Complaint  Patient presents with  . Right Knee - Pain      HPI: Patient is a 52 year old gentleman who presents with constant pain laterally and medially of the right knee complains of popping.  He has pain going up and down stairs.  He states that diclofenac that he just received from urgent care has helped.  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  Assessment & Plan: Visit Diagnoses:  1. Right knee pain, unspecified chronicity     Plan: Right knee was injected from the anterior medial portal follow-up in 4 weeks he was given instructions and demonstrated closed chain kinetic exercises.  Patient may benefit from hyaluronic acid.  Follow-Up Instructions: Return in about 1 month (around 08/11/2017).   Ortho Exam  Patient is alert, oriented, no adenopathy, well-dressed, normal affect, normal respiratory effort. Examination patient has full range of motion of the right knee there is no crepitation with range of motion he does have tenderness to palpation medial joint line Clauser cruciates are stable there is no effusion no redness no cellulitis.  He does have a scar in the popliteal fossa.  Imaging: Xr Knee 1-2 Views Right  Result Date: 07/14/2017 2 view radiographs of the right knee shows medial joint line narrowing with subcondylar sclerosis.  There are no articular changes in the left knee.  No images are attached to the encounter.  Labs: No results found for: HGBA1C, ESRSEDRATE, CRP, LABURIC, REPTSTATUS, GRAMSTAIN, CULT, LABORGA  No results found for: HGBA1C  Body mass index is 32.78 kg/m.  Orders:   Orders Placed This Encounter  Procedures  . Large Joint Inj  . XR Knee 1-2 Views Right   No orders of the defined types were placed in this encounter.    Procedures: Large Joint Inj: R knee on 07/14/2017 9:55 AM Indications: pain and diagnostic evaluation Details: 22 G 1.5 in needle, anteromedial approach  Arthrogram: No  Medications: 5 mL lidocaine 1 %; 40 mg methylPREDNISolone acetate 40 MG/ML Outcome: tolerated well, no immediate complications Procedure, treatment alternatives, risks and benefits explained, specific risks discussed. Consent was given by the patient. Immediately prior to procedure a time out was called to verify the correct patient, procedure, equipment, support staff and site/side marked as required. Patient was prepped and draped in the usual sterile fashion.      Clinical Data: No additional findings.  ROS:  All other systems negative, except as noted in the HPI. Review of Systems  Objective: Vital Signs: Ht 5\' 11"  (1.803 m)   Wt 235 lb (106.6 kg)   BMI 32.78 kg/m   Specialty Comments:  No specialty comments available.  PMFS History: Patient Active Problem List   Diagnosis Date Noted  . Acute pain of right knee 07/12/2017  . Morbid obesity ([place]) 07/12/2017  . Hypertension 05/17/2011  . Hyperlipidemia 05/17/2011  . OSA (obstructive sleep apnea) 05/17/2011   Past Medical History:  Diagnosis Date  .  Gout    Right large toe  . Hematuria    pt reports chronic.   Marland Kitchen HTN (hypertension)    Dr. Acie Fredrickson  . Hypercholesterolemia   . Meniscal injury 1990  . OSA (obstructive sleep apnea)    wears CPAP; Dr. Annamaria Boots  . Tinea corporis     Family History  Problem Relation Age of Onset  . Heart murmur Father   . Hearing loss Father   . Hypertension Mother   . COPD Mother   . Cancer Brother     Past Surgical History:  Procedure Laterality Date  . FINGER SURGERY    . KNEE ARTHROSCOPY W/ MENISCAL REPAIR Right 1990  . KNEE SURGERY     Broken Knee  Cap  . pilonidal cyst resection    . POPLITEAL SYNOVIAL CYST EXCISION Right    3rd grade   Social History   Occupational History  . Occupation: IT trainer  Tobacco Use  . Smoking status: Former Smoker    Packs/day: 0.50    Years: 3.00    Pack years: 1.50    Types: Cigarettes    Last attempt to quit: 03/15/1990    Years since quitting: 27.3  . Smokeless tobacco: Never Used  Substance and Sexual Activity  . Alcohol use: Yes    Alcohol/week: 0.0 oz  . Drug use: No  . Sexual activity: Yes    Partners: Female    Comment: Married

## 2017-07-18 ENCOUNTER — Telehealth (INDEPENDENT_AMBULATORY_CARE_PROVIDER_SITE_OTHER): Payer: Self-pay

## 2017-07-18 DIAGNOSIS — M9903 Segmental and somatic dysfunction of lumbar region: Secondary | ICD-10-CM | POA: Diagnosis not present

## 2017-07-18 DIAGNOSIS — M25561 Pain in right knee: Secondary | ICD-10-CM | POA: Diagnosis not present

## 2017-07-18 NOTE — Telephone Encounter (Signed)
Patient called stating that he is having right knee pain.  Patient saw Dr. Sharol Given on Thursday, 07/14/2017 and received a cortisone injection. Would like a call back.  CB# is [phone].  Please advise.  Thank you.

## 2017-07-19 DIAGNOSIS — M9903 Segmental and somatic dysfunction of lumbar region: Secondary | ICD-10-CM | POA: Diagnosis not present

## 2017-07-19 DIAGNOSIS — M25561 Pain in right knee: Secondary | ICD-10-CM | POA: Diagnosis not present

## 2017-07-21 DIAGNOSIS — M9903 Segmental and somatic dysfunction of lumbar region: Secondary | ICD-10-CM | POA: Diagnosis not present

## 2017-07-21 DIAGNOSIS — M25561 Pain in right knee: Secondary | ICD-10-CM | POA: Diagnosis not present

## 2017-07-26 DIAGNOSIS — M25561 Pain in right knee: Secondary | ICD-10-CM | POA: Diagnosis not present

## 2017-07-26 DIAGNOSIS — M9903 Segmental and somatic dysfunction of lumbar region: Secondary | ICD-10-CM | POA: Diagnosis not present

## 2017-08-11 ENCOUNTER — Ambulatory Visit (INDEPENDENT_AMBULATORY_CARE_PROVIDER_SITE_OTHER): Payer: BLUE CROSS/BLUE SHIELD | Admitting: Orthopedic Surgery

## 2017-08-11 ENCOUNTER — Encounter (INDEPENDENT_AMBULATORY_CARE_PROVIDER_SITE_OTHER): Payer: Self-pay | Admitting: Orthopedic Surgery

## 2017-08-11 DIAGNOSIS — S83241D Other tear of medial meniscus, current injury, right knee, subsequent encounter: Secondary | ICD-10-CM | POA: Diagnosis not present

## 2017-08-11 NOTE — Progress Notes (Signed)
Office Visit Note   Patient: Jacob Aguirre           Date of Birth: 23-Jan-1966           MRN: [id number] Visit Date: 08/11/2017              Requested by: Ma Hillock, DO [address] [place], [place] [address] PCP: Ma Hillock, DO  Chief Complaint  Patient presents with  . Right Knee - Follow-up, Pain      HPI: Patient is a 52 year old gentleman who presents for follow-up status post intra-articular injection right knee for swelling and medial joint line pain.  Patient states he had good relief for about 3 days he states that he stopped taking his diclofenac.  Patient states that the swelling in the back of his knee has also resolved but he states that when he was plugging a tire he had a twisting internal rotation had a acute onset of pain to the medial joint line.  Assessment & Plan: Visit Diagnoses:  1. Tear of medial meniscus of right knee, current, unspecified tear type, subsequent encounter     Plan: With patient's medial meniscal symptoms we will obtain an MRI scan to see if arthroscopy would be beneficial.  Follow-up after the MRI scan is approved and obtained.  Follow-Up Instructions: Return if symptoms worsen or fail to improve.   Ortho Exam  Patient is alert, oriented, no adenopathy, well-dressed, normal affect, normal respiratory effort. Examination patient has an antalgic gait there is no effusion there is mild crepitation range of motion collaterals and cruciates are stable patient is point tender to palpation of the medial joint line flexion and rotation is painful.  Imaging: No results found. No images are attached to the encounter.  Labs: No results found for: HGBA1C, ESRSEDRATE, CRP, LABURIC, REPTSTATUS, GRAMSTAIN, CULT, LABORGA   Lab Results  Component Value Date   ALBUMIN 4.6 05/27/2016   ALBUMIN 4.2 12/24/2014   ALBUMIN 4.5 02/15/2014    There is no height or weight on file to calculate BMI.  Orders:  Orders Placed This Encounter    Procedures  . MR Knee Right w/o contrast   No orders of the defined types were placed in this encounter.    Procedures: No procedures performed  Clinical Data: No additional findings.  ROS:  All other systems negative, except as noted in the HPI. Review of Systems  Objective: Vital Signs: There were no vitals taken for this visit.  Specialty Comments:  No specialty comments available.  PMFS History: Patient Active Problem List   Diagnosis Date Noted  . Acute pain of right knee 07/12/2017  . Morbid obesity ([place]) 07/12/2017  . Hypertension 05/17/2011  . Hyperlipidemia 05/17/2011  . OSA (obstructive sleep apnea) 05/17/2011   Past Medical History:  Diagnosis Date  . Gout    Right large toe  . Hematuria    pt reports chronic.   Marland Kitchen HTN (hypertension)    Dr. Acie Fredrickson  . Hypercholesterolemia   . Meniscal injury 1990  . OSA (obstructive sleep apnea)    wears CPAP; Dr. Annamaria Boots  . Tinea corporis     Family History  Problem Relation Age of Onset  . Heart murmur Father   . Hearing loss Father   . Hypertension Mother   . COPD Mother   . Cancer Brother     Past Surgical History:  Procedure Laterality Date  . FINGER SURGERY    . KNEE ARTHROSCOPY W/ MENISCAL  REPAIR Right 1990  . KNEE SURGERY     Broken Knee Cap  . pilonidal cyst resection    . POPLITEAL SYNOVIAL CYST EXCISION Right    3rd grade   Social History   Occupational History  . Occupation: IT trainer  Tobacco Use  . Smoking status: Former Smoker    Packs/day: 0.50    Years: 3.00    Pack years: 1.50    Types: Cigarettes    Last attempt to quit: 03/15/1990    Years since quitting: 27.4  . Smokeless tobacco: Never Used  Substance and Sexual Activity  . Alcohol use: Yes    Alcohol/week: 0.0 oz  . Drug use: No  . Sexual activity: Yes    Partners: Female    Comment: Married

## 2017-08-18 DIAGNOSIS — Z713 Dietary counseling and surveillance: Secondary | ICD-10-CM | POA: Diagnosis not present

## 2017-08-18 DIAGNOSIS — E7800 Pure hypercholesterolemia, unspecified: Secondary | ICD-10-CM | POA: Diagnosis not present

## 2017-08-18 DIAGNOSIS — I10 Essential (primary) hypertension: Secondary | ICD-10-CM | POA: Diagnosis not present

## 2017-08-18 DIAGNOSIS — E669 Obesity, unspecified: Secondary | ICD-10-CM | POA: Diagnosis not present

## 2017-09-01 ENCOUNTER — Ambulatory Visit
Admission: RE | Admit: 2017-09-01 | Discharge: 2017-09-01 | Disposition: A | Discharge status: Home / Self Care | Payer: BLUE CROSS/BLUE SHIELD | Source: Ambulatory Visit | Attending: Orthopedic Surgery | Admitting: Orthopedic Surgery

## 2017-09-01 DIAGNOSIS — S83241D Other tear of medial meniscus, current injury, right knee, subsequent encounter: Secondary | ICD-10-CM

## 2017-09-01 DIAGNOSIS — M25561 Pain in right knee: Secondary | ICD-10-CM | POA: Diagnosis not present

## 2017-09-19 ENCOUNTER — Ambulatory Visit (INDEPENDENT_AMBULATORY_CARE_PROVIDER_SITE_OTHER): Payer: BLUE CROSS/BLUE SHIELD | Admitting: Orthopedic Surgery

## 2017-09-19 ENCOUNTER — Encounter (INDEPENDENT_AMBULATORY_CARE_PROVIDER_SITE_OTHER): Payer: Self-pay | Admitting: Orthopedic Surgery

## 2017-09-19 DIAGNOSIS — S83241D Other tear of medial meniscus, current injury, right knee, subsequent encounter: Secondary | ICD-10-CM

## 2017-09-19 NOTE — Progress Notes (Signed)
Office Visit Note   Patient: Jacob Aguirre           Date of Birth: [birth year]           MRN: [id number] Visit Date: 09/19/2017              Requested by: Ma Hillock, DO [address] [place], [place] [address] PCP: Ma Hillock, DO  Chief Complaint  Patient presents with  . Right Knee - Follow-up    MRI Review      HPI: Patient is a 52 year old gentleman with mechanical symptoms right knee medial joint line.  Patient states that he cannot get up with putting weight on the right knee he states that twisting and rotation is extremely painful he has mechanical symptoms along the medial joint line.  Assessment & Plan: Visit Diagnoses:  1. Tear of medial meniscus of right knee, current, unspecified tear type, subsequent encounter     Plan: Discussed that his best option is to proceed with arthroscopic intervention.  Discussed that with the arthritis he may still have some arthritis knee pain but we should be able to resolve the mechanical symptoms with partial meniscectomy.  Risks and benefits were discussed including infection neurovascular injury pain DVT need for additional surgery.  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  He should be able to complete his functional test in March.  Patient states he would like to proceed with surgery in about a month we will call to set this up.  Follow-Up Instructions: Return in about 2 weeks (around 10/03/2017).   Ortho Exam  Patient is alert, oriented, no adenopathy, well-dressed, normal affect, normal respiratory effort. Examination patient has an antalgic gait.  Flexion internal rotation is painful collaterals and cruciates are stable and is maximally tender to palpation along the joint line.  MRI scan shows complex medial meniscal tear as well as osteoarthritis of the medial joint line.  Imaging: No results found. No images are attached to the encounter.  Labs: No results found for: HGBA1C,  ESRSEDRATE, CRP, LABURIC, REPTSTATUS, GRAMSTAIN, CULT, LABORGA   Lab Results  Component Value Date   ALBUMIN 4.6 05/27/2016   ALBUMIN 4.2 12/24/2014   ALBUMIN 4.5 02/15/2014    There is no height or weight on file to calculate BMI.  Orders:  No orders of the defined types were placed in this encounter.  No orders of the defined types were placed in this encounter.    Procedures: No procedures performed  Clinical Data: No additional findings.  ROS:  All other systems negative, except as noted in the HPI. Review of Systems  Objective: Vital Signs: There were no vitals taken for this visit.  Specialty Comments:  No specialty comments available.  PMFS History: Patient Active Problem List   Diagnosis Date Noted  . Acute pain of right knee 07/12/2017  . Morbid obesity ([place]) 07/12/2017  . Hypertension 05/17/2011  . Hyperlipidemia 05/17/2011  . OSA (obstructive sleep apnea) 05/17/2011   Past Medical History:  Diagnosis Date  . Gout    Right large toe  . Hematuria    pt reports chronic.   Marland Kitchen HTN (hypertension)    Dr. Acie Fredrickson  . Hypercholesterolemia   . Meniscal injury 1990  . OSA (obstructive sleep apnea)    wears CPAP; Dr. Annamaria Boots  . Tinea corporis     Family History  Problem Relation Age of Onset  . Heart murmur Father   .  Hearing loss Father   . Hypertension Mother   . COPD Mother   . Cancer Brother     Past Surgical History:  Procedure Laterality Date  . FINGER SURGERY    . KNEE ARTHROSCOPY W/ MENISCAL REPAIR Right 1990  . KNEE SURGERY     Broken Knee Cap  . pilonidal cyst resection    . POPLITEAL SYNOVIAL CYST EXCISION Right    3rd grade   Social History   Occupational History  . Occupation: IT trainer  Tobacco Use  . Smoking status: Former Smoker    Packs/day: 0.50    Years: 3.00    Pack years: 1.50    Types: Cigarettes    Last attempt to quit: 03/15/1990    Years since quitting: 27.5  . Smokeless tobacco: Never Used  Substance and  Sexual Activity  . Alcohol use: Yes    Alcohol/week: 0.0 oz  . Drug use: No  . Sexual activity: Yes    Partners: Female    Comment: Married

## 2017-09-26 ENCOUNTER — Other Ambulatory Visit: Payer: Self-pay | Admitting: Cardiovascular Disease

## 2017-09-26 DIAGNOSIS — I10 Essential (primary) hypertension: Secondary | ICD-10-CM

## 2017-10-05 ENCOUNTER — Telehealth: Payer: Self-pay | Admitting: Cardiovascular Disease

## 2017-10-05 ENCOUNTER — Other Ambulatory Visit: Payer: Self-pay

## 2017-10-05 DIAGNOSIS — I10 Essential (primary) hypertension: Secondary | ICD-10-CM

## 2017-10-05 MED ORDER — LISINOPRIL 20 MG PO TABS: 20.0000 mg | ORAL_TABLET | Freq: Every day | ORAL | 0 refills | Status: DC

## 2017-10-05 NOTE — Telephone Encounter (Signed)
New Message        *STAT* If patient is at the pharmacy, call can be transferred to refill team.   1. Which medications need to be refilled? (please list name of each medication and dose if known) Lisinopril 20 mg/Lipitor 40 mg  2. Which pharmacy/location (including street and city if local pharmacy) is medication to be sent to? [facility]  3. Do they need a 30 day or 90 day supply? Jacob Aguirre

## 2017-10-17 ENCOUNTER — Other Ambulatory Visit: Payer: Self-pay | Admitting: Cardiovascular Disease

## 2017-11-16 DIAGNOSIS — I10 Essential (primary) hypertension: Secondary | ICD-10-CM | POA: Diagnosis not present

## 2017-11-16 DIAGNOSIS — E7800 Pure hypercholesterolemia, unspecified: Secondary | ICD-10-CM | POA: Diagnosis not present

## 2017-11-16 DIAGNOSIS — E669 Obesity, unspecified: Secondary | ICD-10-CM | POA: Diagnosis not present

## 2017-11-16 DIAGNOSIS — Z713 Dietary counseling and surveillance: Secondary | ICD-10-CM | POA: Diagnosis not present

## 2017-11-29 DIAGNOSIS — Z23 Encounter for immunization: Secondary | ICD-10-CM | POA: Diagnosis not present

## 2017-12-14 ENCOUNTER — Ambulatory Visit: Payer: BLUE CROSS/BLUE SHIELD | Admitting: Family Medicine

## 2017-12-14 ENCOUNTER — Encounter: Payer: Self-pay | Admitting: Family Medicine

## 2017-12-14 VITALS — BP 137/88 | HR 66 | Temp 98.2°F | Resp 20 | Ht 71.0 in | Wt 245.0 lb

## 2017-12-14 DIAGNOSIS — M25561 Pain in right knee: Secondary | ICD-10-CM | POA: Diagnosis not present

## 2017-12-14 DIAGNOSIS — B359 Dermatophytosis, unspecified: Secondary | ICD-10-CM

## 2017-12-14 DIAGNOSIS — Z1211 Encounter for screening for malignant neoplasm of colon: Secondary | ICD-10-CM

## 2017-12-14 MED ORDER — CLOTRIMAZOLE 1 % EX CREA: 1.0000 "application " | TOPICAL_CREAM | Freq: Two times a day (BID) | CUTANEOUS | 0 refills | Status: DC

## 2017-12-14 NOTE — Progress Notes (Signed)
Jacob Aguirre , [birth year], 52 y.o., male MRN: [id number] Patient Care Team    Relationship Specialty Notifications Start End  Ma Hillock, DO PCP - General Family Medicine  07/12/17   Nahser, Wonda Cheng, MD  Cardiology  08/27/11   Deneise Lever, MD Consulting Physician Pulmonary Disease  07/12/17     Chief Complaint  Patient presents with  . Knee Pain    right     Subjective: Colon cancer screening Requesting a referral to GI for screening.   Acute on chronic pain of right knee Last note from Dr. Sharol Given stated to schedule him for surgery, pt has not heard back. MRI positive for meniscal tear.   Tinea Started on his left shin. It is itchy. Spread to groin area. He had a similar rash a few years ago and resolved with Lamisil spray.    Depression screen [facility] 2/9 07/12/2017  Decreased Interest 0  Down, Depressed, Hopeless 0  PHQ - 2 Score 0    Allergies  Allergen Reactions  . Erythromycin   . Niacin And Related     Hives  . Penicillins     Causes Hives   Social History   Tobacco Use  . Smoking status: Former Smoker    Packs/day: 0.50    Years: 3.00    Pack years: 1.50    Types: Cigarettes    Last attempt to quit: 03/15/1990    Years since quitting: 27.7  . Smokeless tobacco: Never Used  Substance Use Topics  . Alcohol use: Yes    Alcohol/week: 0.0 standard drinks   Past Medical History:  Diagnosis Date  . Gout    Right large toe  . Hematuria    pt reports chronic.   Marland Kitchen HTN (hypertension)    Dr. Acie Fredrickson  . Hypercholesterolemia   . Meniscal injury 1990  . OSA (obstructive sleep apnea)    wears CPAP; Dr. Annamaria Boots  . Tinea corporis    Past Surgical History:  Procedure Laterality Date  . FINGER SURGERY    . KNEE ARTHROSCOPY W/ MENISCAL REPAIR Right 1990  . KNEE SURGERY     Broken Knee Cap  . pilonidal cyst resection    . POPLITEAL SYNOVIAL CYST EXCISION Right    3rd grade   Family History  Problem Relation Age of Onset  . Heart murmur Father   .  Hearing loss Father   . Hypertension Mother   . COPD Mother   . Cancer Brother    Allergies as of 12/14/2017      Reactions   Erythromycin    Niacin And Related    Hives   Penicillins    Causes Hives      Medication List        Accurate as of 12/14/17  1:09 PM. Always use your most recent med list.          aspirin 81 MG chewable tablet Chew by mouth.   atorvastatin 40 MG tablet Commonly known as:  LIPITOR Take 1 tablet (40 mg total) by mouth daily. Please keep upcoming appt in October with Dr. Acie Fredrickson before anymore refills. Thank you   diclofenac 75 MG EC tablet Commonly known as:  VOLTAREN Take 1 tablet (75 mg total) by mouth 2 (two) times daily.   FLAXSEED OIL PO Take by mouth.   lisinopril 20 MG tablet Commonly known as:  PRINIVIL,ZESTRIL Take 1 tablet (20 mg total) by mouth daily.   OMEGA 3 PO Take 1  tablet by mouth 3 (three) times daily.   TOBRADEX ST 0.3-0.05 % Susp Generic drug:  Tobramycin-Dexamethasone PLEASE SEE ATTACHED FOR DETAILED DIRECTIONS       All past medical history, surgical history, allergies, family history, immunizations andmedications were updated in the EMR today and reviewed under the history and medication portions of their EMR.     ROS: Negative, with the exception of above mentioned in HPI   Objective:  BP 137/88 (BP Location: Left Arm, Patient Position: Sitting, Cuff Size: Large)   Pulse 66   Temp 98.2 F (36.8 C)   Resp 20   Ht 5\' 11"  (1.803 m)   Wt 245 lb (111.1 kg)   SpO2 96%   BMI 34.17 kg/m  Body mass index is 34.17 kg/m. Gen: Afebrile. No acute distress. Nontoxic in appearance, well developed, well nourished.  Eyes:Pupils Equal Round Reactive to light, Extraocular movements intact,  Conjunctiva without redness, discharge or icterus. Skin: red scaly patchy rash left shin area, red patchy rash scrotum x2 areas and glands penis.  No purpura or petechiae.  Neuro: Normal gait. PERLA. EOMi. Alert. Oriented x3   No  exam data present No results found. No results found for this or any previous visit (from the past 24 hour(s)).  Assessment/Plan: SAGAR TENGAN is a 52 y.o. male present for OV for  Colon cancer screening - Ambulatory referral to Gastroenterology  Acute on chronic pain of right knee Last note from Dr. Sharol Given stated to schedule him for surgery, pt has not heard back. Encouraged him to call to get it scheduled.   Tinea Rash appears tinea.  - hygiene discussed. New towel etc.  Clotrimazole cream prescribed.  If not improving or worsening in 1 week, he is to followup. If improving, treat with cream up to 1 week after rash resolves.     Reviewed expectations re: course of current medical issues.  Discussed self-management of symptoms.  Outlined signs and symptoms indicating need for more acute intervention.  Patient verbalized understanding and all questions were answered.  Patient received an After-Visit Summary.    No orders of the defined types were placed in this encounter.    Note is dictated utilizing voice recognition software. Although note has been proof read prior to signing, occasional typographical errors still can be missed. If any questions arise, please do not hesitate to call for verification.   electronically signed by:  Howard Pouch, DO  [place]

## 2017-12-14 NOTE — Patient Instructions (Signed)
Start cream over areas of rash. Do not re-use towels.  You should see an improvement in a week, resolution can take 4 weeks.   Referred for colonoscopy.   Please call Dr. Sharol Given office and get your knee surgery scheduled.    Please help [place] help you:  We are honored you have chosen [facility] for your Primary Care home. Below you will find basic instructions that you may need to access in the future. Please help [place] help you by reading the instructions, which cover many of the frequent questions we experience.   Prescription refills and request:  -In order to allow more efficient response time, please call your pharmacy for all refills. They will forward the request electronically to [place]. This allows for the quickest possible response. Request left on a nurse line can take longer to refill, since these are checked as time allows between office patients and other phone calls.  - refill request can take up to 3-5 working days to complete.  - If request is sent electronically and request is appropiate, it is usually completed in 1-2 business days.  - all patients will need to be seen routinely for all chronic medical conditions requiring prescription medications (see follow-up below). If you are overdue for follow up on your condition, you will be asked to make an appointment and we will call in enough medication to cover you until your appointment (up to 30 days).  - all controlled substances will require a face to face visit to request/refill.  - if you desire your prescriptions to go through a new pharmacy, and have an active script at original pharmacy, you will need to call your pharmacy and have scripts transferred to new pharmacy. This is completed between the pharmacy locations and not by your provider.    Results: If any images or labs were ordered, it can take up to 1 week to get results depending on the test ordered and the lab/facility running and resulting the test. - Normal or  stable results, which do not need further discussion, may be released to your mychart immediately with attached note to you. A call may not be generated for normal results. Please make certain to sign up for mychart. If you have questions on how to activate your mychart you can call the front office.  - If your results need further discussion, our office will attempt to contact you via phone, and if unable to reach you after 2 attempts, we will release your abnormal result to your mychart with instructions.  - All results will be automatically released in mychart after 1 week.  - Your provider will provide you with explanation and instruction on all relevant material in your results. Please keep in mind, results and labs may appear confusing or abnormal to the untrained eye, but it does not mean they are actually abnormal for you personally. If you have any questions about your results that are not covered, or you desire more detailed explanation than what was provided, you should make an appointment with your provider to do so.   Our office handles many outgoing and incoming calls daily. If we have not contacted you within 1 week about your results, please check your mychart to see if there is a message first and if not, then contact our office.  In helping with this matter, you help decrease call volume, and therefore allow [place] to be able to respond to patients needs more efficiently.   Acute office  visits (sick visit):  An acute visit is intended for a new problem and are scheduled in shorter time slots to allow schedule openings for patients with new problems. This is the appropriate visit to discuss a new problem. Problems will not be addressed by phone call or Echart message. Appointment is needed if requesting treatment. In order to provide you with excellent quality medical care with proper time for you to explain your problem, have an exam and receive treatment with instructions, these appointments  should be limited to one new problem per visit. If you experience a new problem, in which you desire to be addressed, please make an acute office visit, we save openings on the schedule to accommodate you. Please do not save your new problem for any other type of visit, let us take care of it properly and quickly for you.   Follow up visits:  Depending on your condition(s) your provider will need to see you routinely in order to provide you with quality care and prescribe medication(s). Most chronic conditions (Example: hypertension, Diabetes, depression/anxiety... etc), require visits a couple times a year. Your provider will instruct you on proper follow up for your personal medical conditions and history. Please make certain to make follow up appointments for your condition as instructed. Failing to do so could result in lapse in your medication treatment/refills. If you request a refill, and are overdue to be seen on a condition, we will always provide you with a 30 day script (once) to allow you time to schedule.    Medicare wellness (well visit): - we have a wonderful Nurse Maudie Mercury), that will meet with you and provide you will yearly medicare wellness visits. These visits should occur yearly (can not be scheduled less than 1 calendar year apart) and cover preventive health, immunizations, advance directives and screenings you are entitled to yearly through your medicare benefits. Do not miss out on your entitled benefits, this is when medicare will pay for these benefits to be ordered for you.  These are strongly encouraged by your provider and is the appropriate type of visit to make certain you are up to date with all preventive health benefits. If you have not had your medicare wellness exam in the last 12 months, please make certain to schedule one by calling the office and schedule your medicare wellness with Maudie Mercury as soon as possible.   Yearly physical (well visit):  - Adults are recommended to be  seen yearly for physicals. Check with your insurance and date of your last physical, most insurances require one calendar year between physicals. Physicals include all preventive health topics, screenings, medical exam and labs that are appropriate for gender/age and history. You may have fasting labs needed at this visit. This is a well visit (not a sick visit), new problems should not be covered during this visit (see acute visit).  - Pediatric patients are seen more frequently when they are younger. Your provider will advise you on well child visit timing that is appropriate for your their age. - This is not a medicare wellness visit. Medicare wellness exams do not have an exam portion to the visit. Some medicare companies allow for a physical, some do not allow a yearly physical. If your medicare allows a yearly physical you can schedule the medicare wellness with our nurse Maudie Mercury and have your physical with your provider after, on the same day. Please check with insurance for your full benefits.   Late Policy/No Shows:  - all  new patients should arrive 15-30 minutes earlier than appointment to allow [place] time  to  obtain all personal demographics,  insurance information and for you to complete office paperwork. - All established patients should arrive 10-15 minutes earlier than appointment time to update all information and be checked in .  - In our best efforts to run on time, if you are late for your appointment you will be asked to either reschedule or if able, we will work you back into the schedule. There will be a wait time to work you back in the schedule,  depending on availability.  - If you are unable to make it to your appointment as scheduled, please call 24 hours ahead of time to allow [place] to fill the time slot with someone else who needs to be seen. If you do not cancel your appointment ahead of time, you may be charged a no show fee.

## 2017-12-22 ENCOUNTER — Encounter: Payer: Self-pay | Admitting: Gastroenterology

## 2018-01-02 ENCOUNTER — Telehealth: Payer: Self-pay | Admitting: Family Medicine

## 2018-01-02 DIAGNOSIS — M9903 Segmental and somatic dysfunction of lumbar region: Secondary | ICD-10-CM | POA: Diagnosis not present

## 2018-01-02 DIAGNOSIS — M25561 Pain in right knee: Secondary | ICD-10-CM | POA: Diagnosis not present

## 2018-01-02 NOTE — Telephone Encounter (Signed)
Left message for patient to call and schedule appointment for evaluation .

## 2018-01-02 NOTE — Telephone Encounter (Signed)
Copied from [place] [id number]. Topic: Quick Communication - See Telephone Encounter >> Jan 02, 2018  8:51 AM Bea Graff, NT wrote: CRM for notification. See Telephone encounter for: 01/02/18. Pt calling and states that the medication for his fungal infection in his left calf is not working to clear up the infection. He wants to see if another med can be ordered or if he needs an appt.

## 2018-01-03 ENCOUNTER — Ambulatory Visit: Payer: BLUE CROSS/BLUE SHIELD | Admitting: Cardiovascular Disease

## 2018-01-03 ENCOUNTER — Encounter: Payer: Self-pay | Admitting: Cardiovascular Disease

## 2018-01-03 VITALS — BP 150/92 | HR 61 | Ht 71.0 in | Wt 251.0 lb

## 2018-01-03 DIAGNOSIS — E782 Mixed hyperlipidemia: Secondary | ICD-10-CM | POA: Diagnosis not present

## 2018-01-03 DIAGNOSIS — I10 Essential (primary) hypertension: Secondary | ICD-10-CM

## 2018-01-03 NOTE — Progress Notes (Signed)
Jacob Aguirre Date of Birth  September 17, 1965        [address]    [address]     [place], [place]  [address]         Problems: 1. Hypercholesterolemia 2. Hypertension 3. Obstructive sleep apnea     Jacob Aguirre has done fairly well since I last saw him. He is trying to eat a little bit better. He's not had any episodes of chest pain or shortness breath.  02/12/2013  Jacob Aguirre is doing well.  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  No Cp , no dyspnea.  Dec. 3, 2015:  Jacob Aguirre is seen for his hyperlipidemia and HTN.  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  Dec. 1, 2016: Doing well. No CP or dyspnea.  Wt Readings from Last 3 Encounters:  02/13/15 241 lb 12.8 oz (109.68 kg)  02/14/14 230 lb (104.327 kg)  02/13/14 234 lb 12.8 oz (106.505 kg)   Has gained a bit of weight/  May 27, 2016:  Doing well.  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    January 03, 2018:  Jacob Aguirre  is seen today for follow-up of his hyperlipidemia and hypertension. No CP or dyspnea Having some knee problems .   Needs arthoscopic surgery at some point  BP is elevated - just got back from the beach and ate lots of salty foods there.    Current Outpatient Medications on File Prior to Visit  Medication Sig Dispense Refill  . atorvastatin (LIPITOR) 40 MG tablet Take 40 mg by mouth daily.    . clotrimazole (LOTRIMIN) 1 % cream Apply 1 application topically 2 (two) times daily. 30 g 0  . diclofenac (VOLTAREN) 75 MG EC tablet Take 1 tablet (75 mg total) by mouth 2 (two) times daily. 180 tablet 3  . Flaxseed, Linseed, (FLAXSEED OIL PO) Take by mouth.    Marland Kitchen lisinopril (PRINIVIL,ZESTRIL) 20 MG tablet Take 1 tablet (20 mg total) by mouth daily. 90 tablet 0  . Omega-3 Fatty Acids (OMEGA 3 PO) Take 1 tablet by mouth 3 (three) times daily.      No current facility-administered medications on file prior to visit.     Allergies    Allergen Reactions  . Erythromycin   . Niacin And Related     Hives  . Penicillins     Causes Hives    Past Medical History:  Diagnosis Date  . Gout    Right large toe  . Hematuria    pt reports chronic.   Marland Kitchen HTN (hypertension)    Dr. Acie Fredrickson  . Hypercholesterolemia   . Meniscal injury 1990  . OSA (obstructive sleep apnea)    wears CPAP; Dr. Annamaria Boots  . Tinea corporis     Past Surgical History:  Procedure Laterality Date  . FINGER SURGERY    . KNEE ARTHROSCOPY W/ MENISCAL REPAIR Right 1990  . KNEE SURGERY     Broken Knee Cap  . pilonidal cyst resection    . POPLITEAL SYNOVIAL CYST EXCISION Right    3rd grade    Social History   Tobacco Use  Smoking Status Former Smoker  . Packs/day: 0.50  . Years: 3.00  . Pack years: 1.50  . Types: Cigarettes  . Last attempt to quit: 03/15/1990  . Years since quitting: 57.8  Smokeless Tobacco Never Used    Social History   Substance and Sexual Activity  Alcohol Use Yes  . Alcohol/week: 0.0 standard drinks    Family History  Problem Relation Age of Onset  . Heart murmur Father   . Hearing loss Father   . Hypertension Mother   . COPD Mother   . Cancer Brother     Reviw of Systems:  Reviewed in the HPI.  All other systems are negative.  Physical Exam: There were no vitals taken for this visit.  GEN:   Middle age man.  Moderately obese  HEENT: Normal NECK: No JVD; No carotid bruits LYMPHATICS: No lymphadenopathy CARDIAC: RRR  RESPIRATORY:  Clear to auscultation without rales, wheezing or rhonchi  ABDOMEN: Soft, non-tender, non-distended MUSCULOSKELETAL:  No edema; No deformity  SKIN: Warm and dry NEUROLOGIC:  Alert and oriented x 3   ECG: January 03, 2018: Normal sinus rhythm at 61.  Normal EKG.  Assessment / Plan:   1. Hypercholesterolemia-  Lab were done in April.   By Dr. Robby Sermon.    [place] will bring labs   2. Hypertension -  .   BP is elevated .  Advised him to work on weight loss and watch his salt  and cholesterol .  Keep a BP log He's to bring his BP log with him at his next visit next year  3. Obstructive sleep apnea - per medical doctor      Mertie Moores, MD  01/03/2018 8:53 AM    [address],  [place], [place]  [address] Pager [phone] Phone: [phone]; Fax: [phone]

## 2018-01-03 NOTE — Patient Instructions (Signed)
Medication Instructions:  The current medical regimen is effective;  continue present plan and medications.  If you need a refill on your cardiac medications before your next appointment, please call your pharmacy.   Follow-Up: At [facility], you and your health needs are our priority.  As part of our continuing mission to provide you with exceptional heart care, we have created designated Provider Care Teams.  These Care Teams include your primary Cardiologist (physician) and Advanced Practice Providers (APPs -  Physician Assistants and Nurse Practitioners) who all work together to provide you with the care you need, when you need it. You will need a follow up appointment in:  12 months.  Please call our office 2 months in advance to schedule this appointment.  You may see Dr Acie Fredrickson or one of the following Advanced Practice Providers on your designated Care Team: Richardson Dopp, PA-C [place], [place] . Daune Perch, NP

## 2018-01-04 ENCOUNTER — Encounter: Payer: Self-pay | Admitting: Family Medicine

## 2018-01-04 ENCOUNTER — Ambulatory Visit: Payer: BLUE CROSS/BLUE SHIELD | Admitting: Family Medicine

## 2018-01-04 VITALS — BP 138/88 | HR 68 | Temp 98.7°F | Resp 20 | Ht 71.0 in | Wt 247.5 lb

## 2018-01-04 DIAGNOSIS — B356 Tinea cruris: Secondary | ICD-10-CM | POA: Diagnosis not present

## 2018-01-04 DIAGNOSIS — R21 Rash and other nonspecific skin eruption: Secondary | ICD-10-CM

## 2018-01-04 MED ORDER — ITRACONAZOLE 100 MG PO CAPS: 100.0000 mg | ORAL_CAPSULE | Freq: Every day | ORAL | 0 refills | Status: AC

## 2018-01-04 MED ORDER — CICLOPIROX OLAMINE 0.77 % EX CREA: TOPICAL_CREAM | Freq: Two times a day (BID) | CUTANEOUS | 1 refills | Status: DC

## 2018-01-04 NOTE — Progress Notes (Signed)
Jacob Aguirre , December 10, 1965, 52 y.o., male MRN: [id number] Patient Care Team    Relationship Specialty Notifications Start End  Jacob Hillock, DO PCP - General Family Medicine  07/12/17   Nahser, Wonda Cheng, MD  Cardiology  08/27/11   Jacob Lever, MD Consulting Physician Pulmonary Disease  07/12/17     Chief Complaint  Patient presents with  . Rash    genital area and left leg     Subjective: Tinea: He reports the cream is helping, but it is not taking the rash away. He has been using cream BID for 2.5 weeks. He has been following the hygiene recommendations as well. He has no prior h/o eczema or psoriasis.   Prior note:  Started on his left shin. It is itchy. Spread to groin area. He had a similar rash a few years ago and resolved with Lamisil spray.    Depression screen [facility] 2/9 07/12/2017  Decreased Interest 0  Down, Depressed, Hopeless 0  PHQ - 2 Score 0    Allergies  Allergen Reactions  . Erythromycin   . Niacin And Related     Hives  . Penicillins     Causes Hives   Social History   Tobacco Use  . Smoking status: Former Smoker    Packs/day: 0.50    Years: 3.00    Pack years: 1.50    Types: Cigarettes    Last attempt to quit: 03/15/1990    Years since quitting: 27.8  . Smokeless tobacco: Never Used  Substance Use Topics  . Alcohol use: Yes    Alcohol/week: 0.0 standard drinks   Past Medical History:  Diagnosis Date  . Gout    Right large toe  . Hematuria    pt reports chronic.   Marland Kitchen HTN (hypertension)    Jacob Aguirre  . Hypercholesterolemia   . Meniscal injury 1990  . OSA (obstructive sleep apnea)    wears CPAP; Dr. Annamaria Aguirre  . Tinea corporis    Past Surgical History:  Procedure Laterality Date  . FINGER SURGERY    . KNEE ARTHROSCOPY W/ MENISCAL REPAIR Right 1990  . KNEE SURGERY     Broken Knee Cap  . pilonidal cyst resection    . POPLITEAL SYNOVIAL CYST EXCISION Right    3rd grade   Family History  Problem Relation Age of Onset  . Heart  murmur Father   . Hearing loss Father   . Hypertension Mother   . COPD Mother   . Cancer Brother    Allergies as of 01/04/2018      Reactions   Erythromycin    Niacin And Related    Hives   Penicillins    Causes Hives      Medication List        Accurate as of 01/04/18  9:36 AM. Always use your most recent med list.          atorvastatin 40 MG tablet Commonly known as:  LIPITOR Take 40 mg by mouth daily.   clotrimazole 1 % cream Commonly known as:  LOTRIMIN Apply 1 application topically 2 (two) times daily.   diclofenac 75 MG EC tablet Commonly known as:  VOLTAREN Take 1 tablet (75 mg total) by mouth 2 (two) times daily.   FLAXSEED OIL PO Take by mouth.   lisinopril 20 MG tablet Commonly known as:  PRINIVIL,ZESTRIL Take 1 tablet (20 mg total) by mouth daily.   OMEGA 3 PO Take 1 tablet by  mouth 3 (three) times daily.       All past medical history, surgical history, allergies, family history, immunizations andmedications were updated in the EMR today and reviewed under the history and medication portions of their EMR.     ROS: Negative, with the exception of above mentioned in HPI   Objective:  BP 138/88 (BP Location: Right Arm, Patient Position: Sitting, Cuff Size: Large)   Pulse 68   Temp 98.7 F (37.1 C)   Resp 20   Ht 5\' 11"  (1.803 m)   Wt 247 lb 8 oz (112.3 kg)   SpO2 97%   BMI 34.52 kg/m  Body mass index is 34.52 kg/m. Gen: Afebrile. No acute distress.  HENT: AT. [place]. . MMM.  Eyes:Pupils Equal Round Reactive to light, Extraocular movements intact,  Conjunctiva without redness, discharge or icterus. Skin: red, no longer scaly rash left shin and scrotal area.no purpura or petechiae.  Neuro: Normal gait. PERLA. EOMi. Alert. Oriented.    No exam data present No results found. No results found for this or any previous visit (from the past 24 hour(s)).  Assessment/Plan: Jacob Aguirre is a 52 y.o. male present for OV for  Tinea Rash  appears to be mildly improved. Redness remains, but scaly areas resolved. Will try oral itraconazole + change in antifungal .    Continue with hygiene recommendations - derm referral placed, in the event his rash does not respond to new treatment plan he should have evaluated for other possible causes.  - F/U 4 weeks if not resolved and not already seen derm.    Reviewed expectations re: course of current medical issues.  Discussed self-management of symptoms.  Outlined signs and symptoms indicating need for more acute intervention.  Patient verbalized understanding and all questions were answered.  Patient received an After-Visit Summary.    No orders of the defined types were placed in this encounter.    Note is dictated utilizing voice recognition software. Although note has been proof read prior to signing, occasional typographical errors still can be missed. If any questions arise, please do not hesitate to call for verification.   electronically signed by:  Jacob Pouch, DO  [place]

## 2018-01-04 NOTE — Patient Instructions (Addendum)
DO not take Lipitor for 14 days while taking the itraconazole. Start the new cream twice a day.  I have placed a referral to dermatology to evaluate in the event the meds do not work.  Pick up OTC Lamisil spray and spray shoes, pants and underwear etc. New towel every wash.   I believe this is fungal, but if it does not resolve with this regimen then we need to consider other potential skin conditions--> so referred you to dermatology to make sure.   Jock Itch Jock itch is an infection of the skin in the groin area. It is caused by a type of germ (fungus). The infection causes a rash and itching in the groin and upper thigh. It is common in people who play sports. Being in places with hot weather and wearing tight or wet clothes can increase the chance of getting jock itch. The rash usually goes away in 2-3 weeks with treatment. Follow these instructions at home:  Take medicines only as told by your doctor. Apply skin creams or ointments exactly as told.  Wear loose-fitting clothing. ? Men should wear cotton boxer shorts. ? Women should wear cotton underwear.  Change your underwear every day to keep your groin dry.  Avoid hot baths.  Dry your groin area well after you take a bath or shower. ? Use a separate towel to dry your groin area. This will help to prevent a spreading of the infection to other areas of your body.  Do not scratch the affected area.  Do not share towels with other people. Contact a doctor if:  Your rash does not improve or it gets worse after 2 weeks of treatment.  Your rash is spreading.  Your rash comes back after treatment is finished.  You have a fever.  You have redness, swelling, or pain in the area around your rash.  You have fluid, blood, or pus coming from your rash.  Your have your rash for more than 4 weeks. This information is not intended to replace advice given to you by your health care provider. Make sure you discuss any questions you  have with your health care provider. Document Released: 05/26/2009 Document Revised: 08/07/2015 Document Reviewed: 12/11/2013 Elsevier Interactive Patient Education  [employer].

## 2018-01-11 ENCOUNTER — Ambulatory Visit (AMBULATORY_SURGERY_CENTER): Payer: Self-pay

## 2018-01-11 VITALS — Ht 71.0 in | Wt 246.4 lb

## 2018-01-11 DIAGNOSIS — Z1211 Encounter for screening for malignant neoplasm of colon: Secondary | ICD-10-CM

## 2018-01-11 MED ORDER — NA SULFATE-K SULFATE-MG SULF 17.5-3.13-1.6 GM/177ML PO SOLN: 1.0000 | Freq: Once | ORAL | 0 refills | Status: AC

## 2018-01-11 NOTE — Progress Notes (Signed)
No egg or soy allergy known to patient  No issues with past sedation with any surgeries  or procedures, no intubation problems  No diet pills per patient No home 02 use per patient  No blood thinners per patient  Pt denies issues with constipation  No A fib or A flutter  EMMI video sent to pt's e mail  

## 2018-01-23 ENCOUNTER — Other Ambulatory Visit: Payer: Self-pay | Admitting: Cardiovascular Disease

## 2018-01-23 DIAGNOSIS — I10 Essential (primary) hypertension: Secondary | ICD-10-CM

## 2018-01-26 ENCOUNTER — Encounter: Payer: BLUE CROSS/BLUE SHIELD | Admitting: Gastroenterology

## 2018-02-01 ENCOUNTER — Encounter: Payer: Self-pay | Admitting: Gastroenterology

## 2018-02-01 DIAGNOSIS — L400 Psoriasis vulgaris: Secondary | ICD-10-CM | POA: Diagnosis not present

## 2018-02-01 DIAGNOSIS — L853 Xerosis cutis: Secondary | ICD-10-CM | POA: Diagnosis not present

## 2018-02-13 DIAGNOSIS — L853 Xerosis cutis: Secondary | ICD-10-CM | POA: Diagnosis not present

## 2018-02-13 DIAGNOSIS — L400 Psoriasis vulgaris: Secondary | ICD-10-CM | POA: Diagnosis not present

## 2018-02-15 ENCOUNTER — Encounter: Payer: Self-pay | Admitting: Gastroenterology

## 2018-02-15 ENCOUNTER — Ambulatory Visit (AMBULATORY_SURGERY_CENTER): Payer: BLUE CROSS/BLUE SHIELD | Admitting: Gastroenterology

## 2018-02-15 VITALS — BP 120/93 | HR 60 | Temp 97.3°F | Resp 12 | Ht 71.0 in | Wt 264.0 lb

## 2018-02-15 DIAGNOSIS — K635 Polyp of colon: Secondary | ICD-10-CM | POA: Diagnosis not present

## 2018-02-15 DIAGNOSIS — Z1211 Encounter for screening for malignant neoplasm of colon: Secondary | ICD-10-CM | POA: Diagnosis not present

## 2018-02-15 DIAGNOSIS — D123 Benign neoplasm of transverse colon: Secondary | ICD-10-CM

## 2018-02-15 MED ORDER — SODIUM CHLORIDE 0.9 % IV SOLN: 500.0000 mL | Freq: Once | INTRAVENOUS | Status: DC

## 2018-02-15 NOTE — Progress Notes (Signed)
Called to room to assist during endoscopic procedure.  Patient ID and intended procedure confirmed with present staff. Received instructions for my participation in the procedure from the performing physician.  

## 2018-02-15 NOTE — Patient Instructions (Signed)
  Handout given on polyps. One removed today.   YOU HAD AN ENDOSCOPIC PROCEDURE TODAY AT [facility]:   Refer to the procedure report that was given to you for any specific questions about what was found during the examination.  If the procedure report does not answer your questions, please call your gastroenterologist to clarify.  If you requested that your care partner not be given the details of your procedure findings, then the procedure report has been included in a sealed envelope for you to review at your convenience later.  YOU SHOULD EXPECT: Some feelings of bloating in the abdomen. Passage of more gas than usual.  Walking can help get rid of the air that was put into your GI tract during the procedure and reduce the bloating. If you had a lower endoscopy (such as a colonoscopy or flexible sigmoidoscopy) you may notice spotting of blood in your stool or on the toilet paper. If you underwent a bowel prep for your procedure, you may not have a normal bowel movement for a few days.  Please Note:  You might notice some irritation and congestion in your nose or some drainage.  This is from the oxygen used during your procedure.  There is no need for concern and it should clear up in a day or so.  SYMPTOMS TO REPORT IMMEDIATELY:   Following lower endoscopy (colonoscopy or flexible sigmoidoscopy):  Excessive amounts of blood in the stool  Significant tenderness or worsening of abdominal pains  Swelling of the abdomen that is new, acute  Fever of 100F or higher   For urgent or emergent issues, a gastroenterologist can be reached at any hour by calling [phone].   DIET:  We do recommend a small meal at first, but then you may proceed to your regular diet.  Drink plenty of fluids but you should avoid alcoholic beverages for 24 hours.  ACTIVITY:  You should plan to take it easy for the rest of today and you should NOT DRIVE or use heavy machinery until tomorrow (because  of the sedation medicines used during the test).    FOLLOW UP: Our staff will call the number listed on your records the next business day following your procedure to check on you and address any questions or concerns that you may have regarding the information given to you following your procedure. If we do not reach you, we will leave a message.  However, if you are feeling well and you are not experiencing any problems, there is no need to return our call.  We will assume that you have returned to your regular daily activities without incident.  If any biopsies were taken you will be contacted by phone or by letter within the next 1-3 weeks.  Please call us at [phone] if you have not heard about the biopsies in 3 weeks.    SIGNATURES/CONFIDENTIALITY: You and/or your care partner have signed paperwork which will be entered into your electronic medical record.  These signatures attest to the fact that that the information above on your After Visit Summary has been reviewed and is understood.  Full responsibility of the confidentiality of this discharge information lies with you and/or your care-partner.

## 2018-02-15 NOTE — Progress Notes (Signed)
Pt's states no medical or surgical changes since previsit or office visit. Diagnosis with Psoriasis.

## 2018-02-15 NOTE — Progress Notes (Signed)
Report to RN, VSS, adequate respirations noted, no c/o pain or discomfort 

## 2018-02-16 ENCOUNTER — Telehealth: Payer: Self-pay

## 2018-02-16 NOTE — Op Note (Signed)
[facility] Patient Name: Jacob Aguirre Procedure Date: 02/15/2018 8:36 AM MRN: [id number] Endoscopist: Gerrit Heck , MD Age: 52 Referring MD:  Date of Birth: 05/08/1965 Gender: Male Account #: [id number] Procedure:                Colonoscopy Indications:              Screening for colorectal malignant neoplasm, This                            is the patient's first colonoscopy Medicines:                Monitored Anesthesia Care Procedure:                Pre-Anesthesia Assessment:                           - Prior to the procedure, a History and Physical                            was performed, and patient medications and                            allergies were reviewed. The patient's tolerance of                            previous anesthesia was also reviewed. The risks                            and benefits of the procedure and the sedation                            options and risks were discussed with the patient.                            All questions were answered, and informed consent                            was obtained. Prior Anticoagulants: The patient has                            taken no previous anticoagulant or antiplatelet                            agents. ASA Grade Assessment: II - A patient with                            mild systemic disease. After reviewing the risks                            and benefits, the patient was deemed in                            satisfactory condition to undergo the procedure.  After obtaining informed consent, the colonoscope                            was passed under direct vision. Throughout the                            procedure, the patient's blood pressure, pulse, and                            oxygen saturations were monitored continuously. The                            Model CF-HQ190L [id number]) scope was introduced                            through the anus and  advanced to the the terminal                            ileum. The colonoscopy was performed without                            difficulty. The patient tolerated the procedure                            well. The quality of the bowel preparation was                            adequate. Scope In: 8:43:18 AM Scope Out: 8:56:06 AM Scope Withdrawal Time: 0 hours 9 minutes 33 seconds  Total Procedure Duration: 0 hours 12 minutes 48 seconds  Findings:                 The perianal and digital rectal examinations were                            normal.                           A 2 mm polyp was found in the transverse colon. The                            polyp was sessile. The polyp was removed with a                            cold biopsy forceps. Resection and retrieval were                            complete. Estimated blood loss was minimal.                           The exam was otherwise normal throughout the                            remainder of the colon.  The terminal ileum appeared normal.                           The retroflexed view of the distal rectum and anal                            verge was normal and showed no anal or rectal                            abnormalities. Complications:            No immediate complications. Estimated Blood Loss:     Estimated blood loss was minimal. Impression:               - One 2 mm polyp in the transverse colon, removed                            with a cold biopsy forceps. Resected and retrieved.                           - The examined portion of the ileum was normal.                           - The distal rectum and anal verge are normal on                            retroflexion view. Recommendation:           - Patient has a contact number available for                            emergencies. The signs and symptoms of potential                            delayed complications were discussed with the                             patient. Return to normal activities tomorrow.                            Written discharge instructions were provided to the                            patient.                           - Resume previous diet today.                           - Continue present medications.                           - Await pathology results.                           - Repeat colonoscopy in 5-10 years for surveillance  based on pathology results.                           - Return to GI office PRN. Gerrit Heck, MD 02/15/2018 9:03:07 AM

## 2018-02-16 NOTE — Telephone Encounter (Signed)
  Follow up Call-  Call back number 02/15/2018  Post procedure Call Back phone  # [phone]  Permission to leave phone message Yes  Some recent data might be hidden     Patient questions:  Do you have a fever, pain , or abdominal swelling? No. Pain Score  0 *  Have you tolerated food without any problems? Yes.    Have you been able to return to your normal activities? Yes.    Do you have any questions about your discharge instructions: Diet   No. Medications  No. Follow up visit  No.  Do you have questions or concerns about your Care? No.  Actions: * If pain score is 4 or above: No action needed, pain <4.

## 2018-02-16 NOTE — Telephone Encounter (Signed)
NO ANSWER, MESSAGE LEFT FOR PATIENT. 

## 2018-02-21 ENCOUNTER — Encounter: Payer: Self-pay | Admitting: Gastroenterology

## 2018-03-22 DIAGNOSIS — Z713 Dietary counseling and surveillance: Secondary | ICD-10-CM | POA: Diagnosis not present

## 2018-03-22 DIAGNOSIS — E7800 Pure hypercholesterolemia, unspecified: Secondary | ICD-10-CM | POA: Diagnosis not present

## 2018-03-22 DIAGNOSIS — I10 Essential (primary) hypertension: Secondary | ICD-10-CM | POA: Diagnosis not present

## 2018-03-22 DIAGNOSIS — E669 Obesity, unspecified: Secondary | ICD-10-CM | POA: Diagnosis not present

## 2018-04-05 ENCOUNTER — Other Ambulatory Visit: Payer: Self-pay | Admitting: Physician Assistant

## 2018-04-05 DIAGNOSIS — D485 Neoplasm of uncertain behavior of skin: Secondary | ICD-10-CM | POA: Diagnosis not present

## 2018-04-05 DIAGNOSIS — L409 Psoriasis, unspecified: Secondary | ICD-10-CM | POA: Diagnosis not present

## 2018-04-18 DIAGNOSIS — Z713 Dietary counseling and surveillance: Secondary | ICD-10-CM | POA: Diagnosis not present

## 2018-04-18 DIAGNOSIS — E7800 Pure hypercholesterolemia, unspecified: Secondary | ICD-10-CM | POA: Diagnosis not present

## 2018-04-18 DIAGNOSIS — I10 Essential (primary) hypertension: Secondary | ICD-10-CM | POA: Diagnosis not present

## 2018-04-18 DIAGNOSIS — E669 Obesity, unspecified: Secondary | ICD-10-CM | POA: Diagnosis not present

## 2018-05-01 ENCOUNTER — Other Ambulatory Visit: Payer: Self-pay | Admitting: Cardiovascular Disease

## 2018-05-17 DIAGNOSIS — L409 Psoriasis, unspecified: Secondary | ICD-10-CM | POA: Diagnosis not present

## 2018-05-18 DIAGNOSIS — I10 Essential (primary) hypertension: Secondary | ICD-10-CM | POA: Diagnosis not present

## 2018-05-18 DIAGNOSIS — E669 Obesity, unspecified: Secondary | ICD-10-CM | POA: Diagnosis not present

## 2018-05-18 DIAGNOSIS — E7800 Pure hypercholesterolemia, unspecified: Secondary | ICD-10-CM | POA: Diagnosis not present

## 2018-05-18 DIAGNOSIS — Z713 Dietary counseling and surveillance: Secondary | ICD-10-CM | POA: Diagnosis not present

## 2018-08-15 ENCOUNTER — Other Ambulatory Visit: Payer: Self-pay | Admitting: Family Medicine

## 2018-08-17 ENCOUNTER — Other Ambulatory Visit: Payer: Self-pay | Admitting: Family Medicine

## 2018-08-17 NOTE — Telephone Encounter (Signed)
Copied from [facility]. Topic: Quick Communication - Rx Refill/Question >> Aug 17, 2018  1:03 PM Erick Blinks wrote: Medication: diclofenac (VOLTAREN) 75 MG EC tablet [[id number]]   Has the patient contacted their pharmacy? Yes  (Agent: If no, request that the patient contact the pharmacy for the refill.) (Agent: If yes, when and what did the pharmacy advise?)  Preferred Pharmacy (with phone number or street name):  [id number] - [place], [address] [place] [address]  Phone: [phone] Fax: [phone]     Agent: Please be advised that RX refills may take up to 3 business days. We ask that you follow-up with your pharmacy.

## 2018-08-18 NOTE — Telephone Encounter (Signed)
Office Already responded to request by another means.

## 2018-08-22 LAB — TSH: TSH: 3.28 (ref 0.41–5.90)

## 2018-08-22 LAB — LIPID PANEL
HDL: 29 — AB (ref 35–70)
LDL Cholesterol: 53
Triglycerides: 263 — AB (ref 40–160)

## 2018-08-22 LAB — PSA: PSA: 2.8

## 2018-08-22 LAB — HEPATIC FUNCTION PANEL
ALT: 250 — AB (ref 10–40)
AST: 100 — AB (ref 14–40)

## 2018-08-22 LAB — HEMOGLOBIN A1C: Hemoglobin A1C: 5.2

## 2018-08-23 LAB — CBC AND DIFFERENTIAL
HCT: 49 (ref 41–53)
Hemoglobin: 16.8 (ref 13.5–17.5)
Platelets: 127 — AB (ref 150–399)
WBC: 6.7

## 2018-10-17 DIAGNOSIS — L409 Psoriasis, unspecified: Secondary | ICD-10-CM | POA: Diagnosis not present

## 2018-10-17 DIAGNOSIS — L304 Erythema intertrigo: Secondary | ICD-10-CM | POA: Diagnosis not present

## 2018-12-15 ENCOUNTER — Other Ambulatory Visit: Payer: Self-pay

## 2018-12-15 ENCOUNTER — Encounter: Payer: Self-pay | Admitting: Family Medicine

## 2018-12-15 ENCOUNTER — Ambulatory Visit: Payer: BC Managed Care – PPO | Admitting: Family Medicine

## 2018-12-15 VITALS — BP 160/98 | HR 58 | Temp 98.2°F | Resp 16 | Ht 71.0 in | Wt 258.2 lb

## 2018-12-15 DIAGNOSIS — E782 Mixed hyperlipidemia: Secondary | ICD-10-CM

## 2018-12-15 DIAGNOSIS — R635 Abnormal weight gain: Secondary | ICD-10-CM

## 2018-12-15 DIAGNOSIS — R319 Hematuria, unspecified: Secondary | ICD-10-CM | POA: Diagnosis not present

## 2018-12-15 DIAGNOSIS — Z23 Encounter for immunization: Secondary | ICD-10-CM

## 2018-12-15 DIAGNOSIS — I10 Essential (primary) hypertension: Secondary | ICD-10-CM

## 2018-12-15 DIAGNOSIS — D696 Thrombocytopenia, unspecified: Secondary | ICD-10-CM

## 2018-12-15 DIAGNOSIS — R7989 Other specified abnormal findings of blood chemistry: Secondary | ICD-10-CM

## 2018-12-15 MED ORDER — BUPROPION HCL ER (SR) 150 MG PO TB12: 150.0000 mg | ORAL_TABLET | Freq: Two times a day (BID) | ORAL | 2 refills | Status: DC

## 2018-12-15 NOTE — Patient Instructions (Addendum)
We will call you with urine results and discuss need for further follow up.  Start Wellbutrin once daily for 3 days then  twice a day. Taper per instructions on the label.  Follow up in 3 months if desiring to continue. This helps with cravings- it has to be used with healthy diet and increase exercise > 150 minutes a week.   Mediterranean Diet A Mediterranean diet refers to food and lifestyle choices that are based on the traditions of countries located on the [employer]. This way of eating has been shown to help prevent certain conditions and improve outcomes for people who have chronic diseases, like kidney disease and heart disease. What are tips for following this plan? Lifestyle  Cook and eat meals together with your family, when possible.  Drink enough fluid to keep your urine clear or pale yellow.  Be physically active every day. This includes: ? Aerobic exercise like running or swimming. ? Leisure activities like gardening, walking, or housework.  Get 7-8 hours of sleep each night.  If recommended by your health care provider, drink red wine in moderation. This means 1 glass a day for nonpregnant women and 2 glasses a day for men. A glass of wine equals 5 oz (150 mL). Reading food labels   Check the serving size of packaged foods. For foods such as rice and pasta, the serving size refers to the amount of cooked product, not dry.  Check the total fat in packaged foods. Avoid foods that have saturated fat or trans fats.  Check the ingredients list for added sugars, such as corn syrup. Shopping  At the grocery store, buy most of your food from the areas near the walls of the store. This includes: ? Fresh fruits and vegetables (produce). ? Grains, beans, nuts, and seeds. Some of these may be available in unpackaged forms or large amounts (in bulk). ? Fresh seafood. ? Poultry and eggs. ? Low-fat dairy products.  Buy whole ingredients instead of prepackaged foods.   Buy fresh fruits and vegetables in-season from local farmers markets.  Buy frozen fruits and vegetables in resealable bags.  If you do not have access to quality fresh seafood, buy precooked frozen shrimp or canned fish, such as tuna, salmon, or sardines.  Buy small amounts of raw or cooked vegetables, salads, or olives from the deli or salad bar at your store.  Stock your pantry so you always have certain foods on hand, such as olive oil, canned tuna, canned tomatoes, rice, pasta, and beans. Cooking  Cook foods with extra-virgin olive oil instead of using butter or other vegetable oils.  Have meat as a side dish, and have vegetables or grains as your main dish. This means having meat in small portions or adding small amounts of meat to foods like pasta or stew.  Use beans or vegetables instead of meat in common dishes like chili or lasagna.  Experiment with different cooking methods. Try roasting or broiling vegetables instead of steaming or sauteing them.  Add frozen vegetables to soups, stews, pasta, or rice.  Add nuts or seeds for added healthy fat at each meal. You can add these to yogurt, salads, or vegetable dishes.  Marinate fish or vegetables using olive oil, lemon juice, garlic, and fresh herbs. Meal planning   Plan to eat 1 vegetarian meal one day each week. Try to work up to 2 vegetarian meals, if possible.  Eat seafood 2 or more times a week.  Have healthy snacks readily  available, such as: ? Vegetable sticks with hummus. ? [place] yogurt. ? Fruit and nut trail mix.  Eat balanced meals throughout the week. This includes: ? Fruit: 2-3 servings a day ? Vegetables: 4-5 servings a day ? Low-fat dairy: 2 servings a day ? Fish, poultry, or lean meat: 1 serving a day ? Beans and legumes: 2 or more servings a week ? Nuts and seeds: 1-2 servings a day ? Whole grains: 6-8 servings a day ? Extra-virgin olive oil: 3-4 servings a day  Limit red meat and sweets to only a  few servings a month What are my food choices?  Mediterranean diet ? Recommended  Grains: Whole-grain pasta. Brown rice. Bulgar wheat. Polenta. Couscous. Whole-wheat bread. [place].  Vegetables: Artichokes. Beets. Broccoli. Cabbage. Carrots. Eggplant. Green beans. Chard. Kale. Spinach. Onions. Leeks. Peas. Squash. Tomatoes. Peppers. Radishes.  Fruits: Apples. Apricots. Avocado. Berries. Bananas. Cherries. Dates. Figs. Grapes. Lemons. Melon. Oranges. Peaches. Plums. Pomegranate.  Meats and other protein foods: Beans. Almonds. Sunflower seeds. Pine nuts. Peanuts. [place]. Salmon. Scallops. Shrimp. [place]. Tilapia. Clams. Oysters. Eggs.  Dairy: Low-fat milk. Cheese. Greek yogurt.  Beverages: Water. Red wine. Herbal tea.  Fats and oils: Extra virgin olive oil. Avocado oil. Grape seed oil.  Sweets and desserts: [place] yogurt with honey. Baked apples. Poached pears. Trail mix.  Seasoning and other foods: Basil. Cilantro. Coriander. Cumin. Mint. Parsley. Sage. Rosemary. Tarragon. Garlic. Oregano. Thyme. Pepper. Balsalmic vinegar. Tahini. Hummus. Tomato sauce. Olives. Mushrooms. ? Limit these  Grains: Prepackaged pasta or rice dishes. Prepackaged cereal with added sugar.  Vegetables: Deep fried potatoes (french fries).  Fruits: Fruit canned in syrup.  Meats and other protein foods: Beef. Pork. Lamb. Poultry with skin. Hot dogs. [place] Salines.  Dairy: Ice cream. Sour cream. Whole milk.  Beverages: Juice. Sugar-sweetened soft drinks. Beer. Liquor and spirits.  Fats and oils: Butter. Canola oil. Vegetable oil. Beef fat (tallow). Lard.  Sweets and desserts: Cookies. Cakes. Pies. Candy.  Seasoning and other foods: Mayonnaise. Premade sauces and marinades. The items listed may not be a complete list. Talk with your dietitian about what dietary choices are right for you. Summary  The Mediterranean diet includes both food and lifestyle choices.  Eat a variety of fresh fruits and vegetables,  beans, nuts, seeds, and whole grains.  Limit the amount of red meat and sweets that you eat.  Talk with your health care provider about whether it is safe for you to drink red wine in moderation. This means 1 glass a day for nonpregnant women and 2 glasses a day for men. A glass of wine equals 5 oz (150 mL). This information is not intended to replace advice given to you by your health care provider. Make sure you discuss any questions you have with your health care provider. Document Released: 10/23/2015 Document Revised: 10/30/2015 Document Reviewed: 10/23/2015 Elsevier Patient Education  2020 [place] [employer] Calories are units of energy. Your body needs a certain amount of calories from food to keep you going throughout the day. When you eat more calories than your body needs, your body stores the extra calories as fat. When you eat fewer calories than your body needs, your body burns fat to get the energy it needs. Calorie counting means keeping track of how many calories you eat and drink each day. Calorie counting can be helpful if you need to lose weight. If you make sure to eat fewer calories than your body needs, you should lose weight.  Ask your health care provider what a healthy weight is for you. For calorie counting to work, you will need to eat the right number of calories in a day in order to lose a healthy amount of weight per week. A dietitian can help you determine how many calories you need in a day and will give you suggestions on how to reach your calorie goal.  A healthy amount of weight to lose per week is usually 1-2 lb (0.5-0.9 kg). This usually means that your daily calorie intake should be reduced by 500-750 calories.  Eating 1,200 - 1,500 calories per day can help most women lose weight.  Eating 1,500 - 1,800 calories per day can help most men lose weight. What is my plan? My goal is to have __________ calories per day. If I have this  many calories per day, I should lose around __________ pounds per week. What do I need to know about calorie counting? In order to meet your daily calorie goal, you will need to:  Find out how many calories are in each food you would like to eat. Try to do this before you eat.  Decide how much of the food you plan to eat.  Write down what you ate and how many calories it had. Doing this is called keeping a food log. To successfully lose weight, it is important to balance calorie counting with a healthy lifestyle that includes regular activity. Aim for 150 minutes of moderate exercise (such as walking) or 75 minutes of vigorous exercise (such as running) each week. Where do I find calorie information?  The number of calories in a food can be found on a Nutrition Facts label. If a food does not have a Nutrition Facts label, try to look up the calories online or ask your dietitian for help. Remember that calories are listed per serving. If you choose to have more than one serving of a food, you will have to multiply the calories per serving by the amount of servings you plan to eat. For example, the label on a package of bread might say that a serving size is 1 slice and that there are 90 calories in a serving. If you eat 1 slice, you will have eaten 90 calories. If you eat 2 slices, you will have eaten 180 calories. How do I keep a food log? Immediately after each meal, record the following information in your food log:  What you ate. Don't forget to include toppings, sauces, and other extras on the food.  How much you ate. This can be measured in cups, ounces, or number of items.  How many calories each food and drink had.  The total number of calories in the meal. Keep your food log near you, such as in a small notebook in your pocket, or use a mobile app or website. Some programs will calculate calories for you and show you how many calories you have left for the day to meet your goal. What  are some calorie counting tips?   Use your calories on foods and drinks that will fill you up and not leave you hungry: ? Some examples of foods that fill you up are nuts and nut butters, vegetables, lean proteins, and high-fiber foods like whole grains. High-fiber foods are foods with more than 5 g fiber per serving. ? Drinks such as sodas, specialty coffee drinks, alcohol, and juices have a lot of calories, yet do not fill you up.  Eat nutritious  foods and avoid empty calories. Empty calories are calories you get from foods or beverages that do not have many vitamins or protein, such as candy, sweets, and soda. It is better to have a nutritious high-calorie food (such as an avocado) than a food with few nutrients (such as a bag of chips).  Know how many calories are in the foods you eat most often. This will help you calculate calorie counts faster.  Pay attention to calories in drinks. Low-calorie drinks include water and unsweetened drinks.  Pay attention to nutrition labels for "low fat" or "fat free" foods. These foods sometimes have the same amount of calories or more calories than the full fat versions. They also often have added sugar, starch, or salt, to make up for flavor that was removed with the fat.  Find a way of tracking calories that works for you. Get creative. Try different apps or programs if writing down calories does not work for you. What are some portion control tips?  Know how many calories are in a serving. This will help you know how many servings of a certain food you can have.  Use a measuring cup to measure serving sizes. You could also try weighing out portions on a kitchen scale. With time, you will be able to estimate serving sizes for some foods.  Take some time to put servings of different foods on your favorite plates, bowls, and cups so you know what a serving looks like.  Try not to eat straight from a bag or box. Doing this can lead to overeating. Put  the amount you would like to eat in a cup or on a plate to make sure you are eating the right portion.  Use smaller plates, glasses, and bowls to prevent overeating.  Try not to multitask (for example, watch TV or use your computer) while eating. If it is time to eat, sit down at a table and enjoy your food. This will help you to know when you are full. It will also help you to be aware of what you are eating and how much you are eating. What are tips for following this plan? Reading food labels  Check the calorie count compared to the serving size. The serving size may be smaller than what you are used to eating.  Check the source of the calories. Make sure the food you are eating is high in vitamins and protein and low in saturated and trans fats. Shopping  Read nutrition labels while you shop. This will help you make healthy decisions before you decide to purchase your food.  Make a grocery list and stick to it. Cooking  Try to cook your favorite foods in a healthier way. For example, try baking instead of frying.  Use low-fat dairy products. Meal planning  Use more fruits and vegetables. Half of your plate should be fruits and vegetables.  Include lean proteins like poultry and fish. How do I count calories when eating out?  Ask for smaller portion sizes.  Consider sharing an entree and sides instead of getting your own entree.  If you get your own entree, eat only half. Ask for a box at the beginning of your meal and put the rest of your entree in it so you are not tempted to eat it.  If calories are listed on the menu, choose the lower calorie options.  Choose dishes that include vegetables, fruits, whole grains, low-fat dairy products, and lean protein.  Choose items  that are boiled, broiled, grilled, or steamed. Stay away from items that are buttered, battered, fried, or served with cream sauce. Items labeled "crispy" are usually fried, unless stated otherwise.  Choose  water, low-fat milk, unsweetened iced tea, or other drinks without added sugar. If you want an alcoholic beverage, choose a lower calorie option such as a glass of wine or light beer.  Ask for dressings, sauces, and syrups on the side. These are usually high in calories, so you should limit the amount you eat.  If you want a salad, choose a garden salad and ask for grilled meats. Avoid extra toppings like bacon, cheese, or fried items. Ask for the dressing on the side, or ask for olive oil and vinegar or lemon to use as dressing.  Estimate how many servings of a food you are given. For example, a serving of cooked rice is  cup or about the size of half a baseball. Knowing serving sizes will help you be aware of how much food you are eating at restaurants. The list below tells you how big or small some common portion sizes are based on everyday objects: ? 1 oz-4 stacked dice. ? 3 oz-1 deck of cards. ? 1 tsp-1 die. ? 1 Tbsp- a ping-pong ball. ? 2 Tbsp-1 ping-pong ball. ?  cup- baseball. ? 1 cup-1 baseball. Summary  Calorie counting means keeping track of how many calories you eat and drink each day. If you eat fewer calories than your body needs, you should lose weight.  A healthy amount of weight to lose per week is usually 1-2 lb (0.5-0.9 kg). This usually means reducing your daily calorie intake by 500-750 calories.  The number of calories in a food can be found on a Nutrition Facts label. If a food does not have a Nutrition Facts label, try to look up the calories online or ask your dietitian for help.  Use your calories on foods and drinks that will fill you up, and not on foods and drinks that will leave you hungry.  Use smaller plates, glasses, and bowls to prevent overeating. This information is not intended to replace advice given to you by your health care provider. Make sure you discuss any questions you have with your health care provider. Document Released: 03/01/2005  Document Revised: 11/18/2017 Document Reviewed: 01/30/2016 Elsevier Patient Education  2020 [employer].

## 2018-12-15 NOTE — Progress Notes (Signed)
Jacob Aguirre , 1965/06/09, 53 y.o., male MRN: [id number] Patient Care Team    Relationship Specialty Notifications Start End  Ma Hillock, DO PCP - General Family Medicine  07/12/17   Nahser, Wonda Cheng, MD  Cardiology  08/27/11   Deneise Lever, MD Consulting Physician Pulmonary Disease  07/12/17     Chief Complaint  Patient presents with  . Follow-up    discuss lab results. patient forgot to bring results. patient stated when he did a urine for work they said he had blood in his urine. he has not seen blood in his urine  . Hematuria     Subjective: Pt presents for an OV with complaints of reports of a hematuria found during a physical for the past 4 years through his work. PSA is also checked by [facility] and normal. He denies personal h/o kidney stones, but reorts his father has had many.He also reports his triglycerides are reported as high. Prior smoker - quit 1992- only 3 yr smoker. His weight has increased, he is not getting as much exercise and is having increased food cravings. He is interested in trying a medication for appetite suppression.  His increased concerns over his cholesterol now. His father had a stroke 6 weeks ago.  His brother was also diagnosed with testicular cancer and prostate cancer at 34.   He forgot his lab result sheet from work, but will drop off later.   Depression screen [facility] 2/9 07/12/2017  Decreased Interest 0  Down, Depressed, Hopeless 0  PHQ - 2 Score 0    Allergies  Allergen Reactions  . Erythromycin   . Niacin And Related     Hives  . Penicillins     Causes Hives   Social History   Social History Narrative   Married.   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    Nonsmoker.   Drinks caffeine.    Smoke alarm in the home. Wears her seatbelt.    Owns firearms.    Feels safe in his relationships.    Past Medical History:  Diagnosis Date  . Gout    Right large toe  . Hematuria    pt reports chronic.   Marland Kitchen HTN (hypertension)    Dr. Acie Fredrickson   . Hypercholesterolemia   . Meniscal injury 1990  . OSA (obstructive sleep apnea)    wears CPAP; Dr. Annamaria Boots  . Sleep apnea    cpap  . Tinea corporis    Past Surgical History:  Procedure Laterality Date  . FINGER SURGERY    . KNEE ARTHROSCOPY W/ MENISCAL REPAIR Right 1990  . KNEE SURGERY     Broken Knee Cap  . pilonidal cyst resection    . POPLITEAL SYNOVIAL CYST EXCISION Right    3rd grade   Family History  Problem Relation Age of Onset  . Heart murmur Father   . Hearing loss Father   . Colon polyps Father   . Hypertension Mother   . COPD Mother   . Cancer Brother   . Colon cancer Neg Hx   . Esophageal cancer Neg Hx   . Rectal cancer Neg Hx   . Stomach cancer Neg Hx    Allergies as of 12/15/2018      Reactions   Erythromycin    Niacin And Related    Hives   Penicillins    Causes Hives      Medication List       Accurate as of December 15, 2018 11:59 PM. If you have any questions, ask your nurse or doctor.        atorvastatin 40 MG tablet Commonly known as: LIPITOR TAKE 1 TABLET BY MOUTH ONCE DAILY   buPROPion 150 MG 12 hr tablet Commonly known as: Wellbutrin SR Take 1 tablet (150 mg total) by mouth 2 (two) times daily. Started by: Howard Pouch, DO   ciclopirox 0.77 % cream Commonly known as: LOPROX Apply topically 2 (two) times daily.   clotrimazole 1 % cream Commonly known as: LOTRIMIN Apply 1 application topically 2 (two) times daily.   diclofenac 75 MG EC tablet Commonly known as: VOLTAREN TAKE 1 TABLET BY MOUTH TWICE A DAY   FLAXSEED OIL PO Take by mouth.   lisinopril 20 MG tablet Commonly known as: ZESTRIL TAKE 1 TABLET BY MOUTH DAILY   OMEGA 3 PO Take 1 tablet by mouth 3 (three) times daily.       All past medical history, surgical history, allergies, family history, immunizations andmedications were updated in the EMR today and reviewed under the history and medication portions of their EMR.     ROS: Negative, with the exception of  above mentioned in HPI   Objective:  BP (!) 160/98 (BP Location: Right Arm, Patient Position: Sitting, Cuff Size: Large)   Pulse (!) 58   Temp 98.2 F (36.8 C) (Temporal)   Resp 16   Ht 5\' 11"  (1.803 m)   Wt 258 lb 4 oz (117.1 kg)   SpO2 96%   BMI 36.02 kg/m  Body mass index is 36.02 kg/m. Gen: Afebrile. No acute distress. Nontoxic in appearance, well developed, well nourished.  HENT: AT. [place].  Eyes:Pupils Equal Round Reactive to light, Extraocular movements intact,  Conjunctiva without redness, discharge or icterus. CV: RRR no murmur, no edema Chest: CTAB, no wheeze or crackles. Good air movement, normal resp effort.  Abd: Soft. obese. NTND. BS present. no Masses palpated. No rebound or guarding. Skin: no rashes, purpura or petechiae.  Neuro:  Normal gait. PERLA. EOMi. Alert. Oriented x3  Psych: Normal affect, dress and demeanor. Normal speech. Normal thought content and judgment.  No exam data present No results found. No results found for this or any previous visit (from the past 24 hour(s)).  Assessment/Plan: Jacob Aguirre is a 53 y.o. male present for OV for  Hematuria, unspecified type - uncertain etiology, no records available.  - start by checking UA today.  - PSA normal- per work labs.  - Urinalysis w microscopic + reflex cultur  Morbid obesity (HCC)/Weight gain Discussed options with him, he would like to star Wellbutrin. Tapering instructions provided.  - f/u 3 mos if desiring refills.   Need for influenza vaccination - Flu Vaccine QUAD 36+ mos IM  Mixed hyperlipidemia/Essential hypertension - will arrange for fasting lab appt. He is only taking omega-3 1000 mg at this time.  - BP not at goal today. Will have re-check at his upcoming lab appt and if still elevated will need increase in med and/or follow up with his cardiologist.  - Fhx of recent stroke in his father.   Elevated LFTs - not mentioned by patient, found when he dropped off his lab results.  Uncertain if there has been any work up to condition.   Thrombocytopenia ([place]) - not mentioned by patient, found when he dropped off his lab results. Uncertain if there has been any work up to condition.     Reviewed expectations re: course of current medical issues.  Discussed self-management of symptoms.  Outlined signs and symptoms indicating need for more acute intervention.  Patient verbalized understanding and all questions were answered.  Patient received an After-Visit Summary.    Orders Placed This Encounter  Procedures  . Flu Vaccine QUAD 36+ mos IM  . Urinalysis w microscopic + reflex cultur  . REFLEXIVE URINE CULTURE     Note is dictated utilizing voice recognition software. Although note has been proof read prior to signing, occasional typographical errors still can be missed. If any questions arise, please do not hesitate to call for verification.   electronically signed by:  Howard Pouch, DO  [place]

## 2018-12-16 LAB — URINALYSIS W MICROSCOPIC + REFLEX CULTURE
Bacteria, UA: NONE SEEN /HPF
Bilirubin Urine: NEGATIVE
Glucose, UA: NEGATIVE
Hgb urine dipstick: NEGATIVE
Hyaline Cast: NONE SEEN /LPF
Ketones, ur: NEGATIVE
Leukocyte Esterase: NEGATIVE
Nitrites, Initial: NEGATIVE
Protein, ur: NEGATIVE
Specific Gravity, Urine: 1.027 (ref 1.001–1.03)
Squamous Epithelial / HPF: NONE SEEN /HPF (ref <=5)
WBC, UA: NONE SEEN /HPF (ref 0–5)
pH: <=5 (ref 5.0–8.0)

## 2018-12-16 LAB — NO CULTURE INDICATED

## 2018-12-18 ENCOUNTER — Encounter: Payer: Self-pay | Admitting: Family Medicine

## 2018-12-18 ENCOUNTER — Telehealth: Payer: Self-pay | Admitting: Family Medicine

## 2018-12-18 ENCOUNTER — Other Ambulatory Visit: Payer: Self-pay

## 2018-12-18 DIAGNOSIS — D696 Thrombocytopenia, unspecified: Secondary | ICD-10-CM | POA: Insufficient documentation

## 2018-12-18 DIAGNOSIS — Z20828 Contact with and (suspected) exposure to other viral communicable diseases: Secondary | ICD-10-CM | POA: Diagnosis not present

## 2018-12-18 DIAGNOSIS — Z20822 Contact with and (suspected) exposure to covid-19: Secondary | ICD-10-CM

## 2018-12-18 DIAGNOSIS — I10 Essential (primary) hypertension: Secondary | ICD-10-CM

## 2018-12-18 DIAGNOSIS — R7989 Other specified abnormal findings of blood chemistry: Secondary | ICD-10-CM

## 2018-12-18 HISTORY — DX: Other specified abnormal findings of blood chemistry: R79.89

## 2018-12-18 NOTE — Telephone Encounter (Signed)
Did he happen to say when he was exposed- since he was here Friday we would want to track if positive and exposure was prior to his Friday appt.

## 2018-12-18 NOTE — Telephone Encounter (Signed)
Please inform patient the following information: His urine did not show increase in red blood cells outside normal range. Would recommend recheck in 3 mos to be certain.  Also, I reviewed his labs he dropped off that he had collected from his work in June.  He had rather significant elevated liver enzymes and low platelets, as well as elevation in his triglycerides. Has he ever had work-up on his elevated liver enzymes?  How long has he had elevated liver enzymes?  These are rather significantly elevated and should be fully evaluated.  Please schedule him for a lab appointment only, must be fasting 9 hours- water  and medications only.  Avoid taking fish oil supplements that morning.  Make sure to have blood pressure medications in his system for at least 2 hours.  We will recheck his blood pressure at his lab appointment and if elevated again will discuss increasing medication versus him following up with his cardiologist.   Once we get all lab results back, if he has not had work-up on liver enzymes and they are elevated on our lab repeat-then we will need to see him back to discuss further evaluation.

## 2018-12-18 NOTE — Telephone Encounter (Signed)
Pt was called and given results. He has not ever been told about his liver enzymes being elevated. No work up, that he knows of, has been completed. Scheduled for lab visit and BP check in 7 days. Pt states that he had an exposure to COVID, got tested at 1:30pm today. Pt was told to call Friday if he has not gotten test results back yet to move appt to another day, he agreed and verbalized understanding.

## 2018-12-18 NOTE — Telephone Encounter (Signed)
Per patient- Friday evening and Saturday was when he exposed to nephew and it was after he was seen in the office

## 2018-12-20 LAB — NOVEL CORONAVIRUS, NAA: SARS-CoV-2, NAA: NOT DETECTED

## 2018-12-25 ENCOUNTER — Other Ambulatory Visit: Payer: Self-pay

## 2018-12-25 ENCOUNTER — Ambulatory Visit (INDEPENDENT_AMBULATORY_CARE_PROVIDER_SITE_OTHER): Payer: BC Managed Care – PPO | Admitting: Family Medicine

## 2018-12-25 DIAGNOSIS — I10 Essential (primary) hypertension: Secondary | ICD-10-CM

## 2018-12-25 DIAGNOSIS — E782 Mixed hyperlipidemia: Secondary | ICD-10-CM | POA: Diagnosis not present

## 2018-12-25 DIAGNOSIS — R7989 Other specified abnormal findings of blood chemistry: Secondary | ICD-10-CM

## 2018-12-25 DIAGNOSIS — D696 Thrombocytopenia, unspecified: Secondary | ICD-10-CM

## 2018-12-25 LAB — COMPREHENSIVE METABOLIC PANEL
ALT: 167 U/L — ABNORMAL HIGH (ref 0–53)
AST: 73 U/L — ABNORMAL HIGH (ref 0–37)
Albumin: 4.4 g/dL (ref 3.5–5.2)
Alkaline Phosphatase: 48 U/L (ref 39–117)
BUN: 22 mg/dL (ref 6–23)
CO2: 28 mEq/L (ref 19–32)
Calcium: 9.6 mg/dL (ref 8.4–10.5)
Chloride: 100 mEq/L (ref 96–112)
Creatinine, Ser: 0.92 mg/dL (ref 0.40–1.50)
GFR: 86.62 mL/min (ref >60.00)
Glucose, Bld: 92 mg/dL (ref 70–99)
Potassium: 4.5 mEq/L (ref 3.5–5.1)
Sodium: 136 mEq/L (ref 135–145)
Total Bilirubin: 0.9 mg/dL (ref 0.2–1.2)
Total Protein: 6.9 g/dL (ref 6.0–8.3)

## 2018-12-25 LAB — CBC WITH DIFFERENTIAL/PLATELET
Basophils Absolute: 0 10*3/uL (ref 0.0–0.1)
Basophils Relative: 0.6 % (ref 0.0–3.0)
Eosinophils Absolute: 0.2 10*3/uL (ref 0.0–0.7)
Eosinophils Relative: 2.2 % (ref 0.0–5.0)
HCT: 49.5 % (ref 39.0–52.0)
Hemoglobin: 16.7 g/dL (ref 13.0–17.0)
Lymphocytes Relative: 26.5 % (ref 12.0–46.0)
Lymphs Abs: 1.9 10*3/uL (ref 0.7–4.0)
MCHC: 33.8 g/dL (ref 30.0–36.0)
MCV: 92.5 fl (ref 78.0–100.0)
Monocytes Absolute: 0.6 10*3/uL (ref 0.1–1.0)
Monocytes Relative: 8.5 % (ref 3.0–12.0)
Neutro Abs: 4.6 10*3/uL (ref 1.4–7.7)
Neutrophils Relative %: 62.2 % (ref 43.0–77.0)
Platelets: 142 10*3/uL — ABNORMAL LOW (ref 150.0–400.0)
RBC: 5.35 Mil/uL (ref 4.22–5.81)
RDW: 13.5 % (ref 11.5–15.5)
WBC: 7.3 10*3/uL (ref 4.0–10.5)

## 2018-12-25 LAB — LIPID PANEL
Cholesterol: 223 mg/dL — ABNORMAL HIGH (ref 0–200)
HDL: 31.4 mg/dL — ABNORMAL LOW (ref >39.00)
NonHDL: 191.25
Total CHOL/HDL Ratio: 7
Triglycerides: 369 mg/dL — ABNORMAL HIGH (ref 0.0–149.0)
VLDL: 73.8 mg/dL — ABNORMAL HIGH (ref 0.0–40.0)

## 2018-12-25 LAB — LDL CHOLESTEROL, DIRECT: Direct LDL: 122 mg/dL

## 2018-12-25 NOTE — Progress Notes (Addendum)
ADVAY PILCHER is a 53 y.o. male presents to the office today for Blood pressure recheck secondary to orders by PCP and elevated BP during last OV 12/15/2018 Blood pressure medication: Lisinopril 20mg  QD If on medication, Last dose was at least 1-2 hours prior to recheck: No- patient took medication at 8:10am today ( his father was sent EMS this morning for SOB so patient was off schedule with RX ) Blood pressure was taken in the left arm after patient rested for 10 minutes.  BP (!) 143/94 (BP Location: Left Arm, Patient Position: Sitting, Cuff Size: Large)   Pulse 60   Resp 16   SpO2 97%   Lisa A., CMA   ____________________________________________________ Bp check w/BP better than when in the office- however it is still above goal.  Would recommend increase lisinopril to 30 mg>> new script sent into pharmacy. He can finish the bottle he has by taking 1.5 tabs for a total dose of 30 mg QD.   - His labs do confirm significantly elevated liver enzymes. - His triglyceride portion of his cholesterol panel is also significantly elevated.  - He has mildly lower than normal platelets which are the clotting component of the blood and is sometimes abnormal in patients with liver issues.   - By review of his prior labs by EMR he has had steadily rising liver enzymes since 2015.     Recommend:        - stop Lipitor and Voltaren. At least for now. These both can cause LFT increase and are filtered through the liver. I suspect the addition of the Voltaren in 2019 potentially could be causing even more of the increase seen.         - we can try mobic in place of the Voltaren to help with pain. Mobic is filtered through  the kidneys, not liver. It is once a day with food. STOP Voltaren.       -  I would recommend a mediterranean diet and regular exercise.  A mediterranean diet is high in fruits, vegetables, whole grains, fish, chicken, nuts, healthy fats (olive oil or canola oil). Low fat dairy. There are  many online resources and books on this diet. Limit butter, margarine, red meat and sweets. Continue fish oil supplement 3000 mg daily. We will discuss further cholesterol coverage at his upcoming appt.         - follow up in 4 weeks with provider appt 3-5 days after lab appt to repeat enzymes and discuss results and further plan. Please schedule BOTH. Thanks.   Electronically Signed by: Howard Pouch, DO [place] [facility] [place]

## 2018-12-26 MED ORDER — LISINOPRIL 30 MG PO TABS: 30.0000 mg | ORAL_TABLET | Freq: Every day | ORAL | 1 refills | Status: DC

## 2018-12-26 MED ORDER — MELOXICAM 15 MG PO TABS: 15.0000 mg | ORAL_TABLET | Freq: Every day | ORAL | 1 refills | Status: DC

## 2018-12-26 NOTE — Addendum Note (Signed)
Addended by: Howard Pouch A on: 12/26/2018 01:08 PM   Modules accepted: Orders

## 2018-12-27 NOTE — Progress Notes (Signed)
Pt was called and message was left on VM for patient to return call

## 2018-12-28 NOTE — Progress Notes (Signed)
Pt was called and given results/instructions. Pt was scheduled for both visits and was sent my chart message with all instructions

## 2018-12-28 NOTE — Progress Notes (Signed)
Pt was called and given all results and instructions. My chart message with all information was sent to patient and appts were scheduled

## 2018-12-31 NOTE — Progress Notes (Signed)
Noted/acceptible bp.' No changes at this time. Signed:  Crissie Sickles, MD           12/31/2018

## 2019-01-16 DIAGNOSIS — E7800 Pure hypercholesterolemia, unspecified: Secondary | ICD-10-CM | POA: Diagnosis not present

## 2019-01-16 DIAGNOSIS — I10 Essential (primary) hypertension: Secondary | ICD-10-CM | POA: Diagnosis not present

## 2019-01-16 DIAGNOSIS — Z713 Dietary counseling and surveillance: Secondary | ICD-10-CM | POA: Diagnosis not present

## 2019-01-16 DIAGNOSIS — E669 Obesity, unspecified: Secondary | ICD-10-CM | POA: Diagnosis not present

## 2019-01-19 ENCOUNTER — Other Ambulatory Visit (INDEPENDENT_AMBULATORY_CARE_PROVIDER_SITE_OTHER): Payer: BC Managed Care – PPO | Admitting: Family Medicine

## 2019-01-19 ENCOUNTER — Other Ambulatory Visit: Payer: Self-pay

## 2019-01-19 DIAGNOSIS — R7989 Other specified abnormal findings of blood chemistry: Secondary | ICD-10-CM

## 2019-01-19 LAB — COMPREHENSIVE METABOLIC PANEL
ALT: 85 U/L — ABNORMAL HIGH (ref 0–53)
AST: 43 U/L — ABNORMAL HIGH (ref 0–37)
Albumin: 4.4 g/dL (ref 3.5–5.2)
Alkaline Phosphatase: 42 U/L (ref 39–117)
BUN: 25 mg/dL — ABNORMAL HIGH (ref 6–23)
CO2: 28 mEq/L (ref 19–32)
Calcium: 9.4 mg/dL (ref 8.4–10.5)
Chloride: 101 mEq/L (ref 96–112)
Creatinine, Ser: 0.89 mg/dL (ref 0.40–1.50)
GFR: 89.97 mL/min (ref >60.00)
Glucose, Bld: 92 mg/dL (ref 70–99)
Potassium: 4.2 mEq/L (ref 3.5–5.1)
Sodium: 139 mEq/L (ref 135–145)
Total Bilirubin: 1.2 mg/dL (ref 0.2–1.2)
Total Protein: 6.8 g/dL (ref 6.0–8.3)

## 2019-01-22 ENCOUNTER — Telehealth: Payer: Self-pay | Admitting: Family Medicine

## 2019-01-22 NOTE — Telephone Encounter (Signed)
Changed to OV and placed on Dr Raoul Pitch schedule at same time as lab appt was

## 2019-01-22 NOTE — Telephone Encounter (Signed)
It appears he was to have an appt with this provider to review lab work and it looks like it was put on the nurse schedule instead for thursday. Please correct. thanks

## 2019-01-25 ENCOUNTER — Ambulatory Visit: Payer: BC Managed Care – PPO | Admitting: Family Medicine

## 2019-01-25 ENCOUNTER — Other Ambulatory Visit: Payer: Self-pay

## 2019-01-25 ENCOUNTER — Ambulatory Visit: Payer: BC Managed Care – PPO

## 2019-01-25 ENCOUNTER — Encounter: Payer: Self-pay | Admitting: Family Medicine

## 2019-01-25 VITALS — BP 131/80 | HR 60 | Temp 98.2°F | Resp 16 | Ht 71.0 in | Wt 247.1 lb

## 2019-01-25 DIAGNOSIS — E782 Mixed hyperlipidemia: Secondary | ICD-10-CM

## 2019-01-25 DIAGNOSIS — R7989 Other specified abnormal findings of blood chemistry: Secondary | ICD-10-CM | POA: Diagnosis not present

## 2019-01-25 DIAGNOSIS — I10 Essential (primary) hypertension: Secondary | ICD-10-CM | POA: Diagnosis not present

## 2019-01-25 DIAGNOSIS — M1710 Unilateral primary osteoarthritis, unspecified knee: Secondary | ICD-10-CM

## 2019-01-25 DIAGNOSIS — D696 Thrombocytopenia, unspecified: Secondary | ICD-10-CM

## 2019-01-25 NOTE — Progress Notes (Signed)
Jacob Aguirre , 12/09/1965, 53 y.o., male MRN: [id number] Patient Care Team    Relationship Specialty Notifications Start End  Ma Hillock, DO PCP - General Family Medicine  07/12/17   Nahser, Wonda Cheng, MD  Cardiology  08/27/11   Deneise Lever, MD Consulting Physician Pulmonary Disease  07/12/17     Chief Complaint  Patient presents with  . Follow-up    review labs     Subjective: Pt presents for an OV to follow-up on elevated liver enzymes reported by his health at work labs.  After receiving his health at work papers we discontinued his Voltaren and Lipitor.  Timeline suggest Voltaren may have increased his LFTs in 2019, however they had been mildly elevated even up into that point.  He does report he is taking omega-3's now for his cholesterol.  His father recently had a stroke and patient will benefit from adding back on cholesterol medicine for CV protection when able.  He states he has started a Mediterranean diet, he is no longer eating red meat.  He has started the Mobic in place of the Voltaren and feels it is working rather well to even better.  Prior note: complaints of reports of a hematuria found during a physical for the past 4 years through his work. PSA is also checked by [facility] and normal. He denies personal h/o kidney stones, but reorts his father has had many.He also reports his triglycerides are reported as high. Prior smoker - quit 1992- only 3 yr smoker. His weight has increased, he is not getting as much exercise and is having increased food cravings. He is interested in trying a medication for appetite suppression.  His increased concerns over his cholesterol now. His father had a stroke 6 weeks ago.  His brother was also diagnosed with testicular cancer and prostate cancer at 29.   He forgot his lab result sheet from work, but will drop off later.   Depression screen [facility] 2/9 01/25/2019 07/12/2017  Decreased Interest 0 0  Down, Depressed, Hopeless 0 0  PHQ - 2  Score 0 0    Allergies  Allergen Reactions  . Erythromycin   . Niacin And Related     Hives  . Penicillins     Causes Hives   Social History   Social History Narrative   Married.   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    Nonsmoker.   Drinks caffeine.    Smoke alarm in the home. Wears her seatbelt.    Owns firearms.    Feels safe in his relationships.    Past Medical History:  Diagnosis Date  . Gout    Right large toe  . Hematuria    pt reports chronic.   Marland Kitchen HTN (hypertension)    Dr. Acie Fredrickson  . Hypercholesterolemia   . Meniscal injury 1990  . OSA (obstructive sleep apnea)    wears CPAP; Dr. Annamaria Boots  . Sleep apnea    cpap  . Tinea corporis    Past Surgical History:  Procedure Laterality Date  . FINGER SURGERY    . KNEE ARTHROSCOPY W/ MENISCAL REPAIR Right 1990  . KNEE SURGERY     Broken Knee Cap  . pilonidal cyst resection    . POPLITEAL SYNOVIAL CYST EXCISION Right    3rd grade   Family History  Problem Relation Age of Onset  . Heart murmur Father   . Hearing loss Father   . Colon polyps Father   .  Stroke Father   . Hypertension Mother   . COPD Mother   . Testicular cancer Brother   . Colon cancer Neg Hx   . Esophageal cancer Neg Hx   . Rectal cancer Neg Hx   . Stomach cancer Neg Hx    Allergies as of 01/25/2019      Reactions   Erythromycin    Niacin And Related    Hives   Penicillins    Causes Hives      Medication List       Accurate as of January 25, 2019  9:41 AM. If you have any questions, ask your nurse or doctor.        atorvastatin 40 MG tablet Commonly known as: LIPITOR TAKE 1 TABLET BY MOUTH ONCE DAILY   buPROPion 150 MG 12 hr tablet Commonly known as: Wellbutrin SR Take 1 tablet (150 mg total) by mouth 2 (two) times daily.   ciclopirox 0.77 % cream Commonly known as: LOPROX Apply topically 2 (two) times daily.   clotrimazole 1 % cream Commonly known as: LOTRIMIN Apply 1 application topically 2 (two) times daily.    diclofenac 75 MG EC tablet Commonly known as: VOLTAREN TAKE 1 TABLET BY MOUTH TWICE A DAY   FLAXSEED OIL PO Take by mouth.   lisinopril 30 MG tablet Commonly known as: ZESTRIL Take 1 tablet (30 mg total) by mouth daily.   meloxicam 15 MG tablet Commonly known as: MOBIC Take 1 tablet (15 mg total) by mouth daily.   OMEGA 3 PO Take 1 tablet by mouth 3 (three) times daily.       All past medical history, surgical history, allergies, family history, immunizations andmedications were updated in the EMR today and reviewed under the history and medication portions of their EMR.     ROS: Negative, with the exception of above mentioned in HPI   Objective:  BP 131/80 (BP Location: Left Arm, Patient Position: Sitting, Cuff Size: Large)   Pulse 60   Temp 98.2 F (36.8 C) (Temporal)   Resp 16   Ht 5\' 11"  (1.803 m)   Wt 247 lb 2 oz (112.1 kg)   SpO2 93%   BMI 34.47 kg/m  Body mass index is 34.47 kg/m. Gen: Afebrile. No acute distress.  HENT: AT. [place].  Eyes:Pupils Equal Round Reactive to light, Extraocular movements intact,  Conjunctiva without redness, discharge or icterus. Neck/lymp/endocrine: Supple, no lymphadenopathy, no thyromegaly CV: RRR no murmur, no edema Chest: CTAB, no wheeze or crackles Abd: Soft.  Obese. NTND. BS present.  No masses palpated.  Skin: No rashes, purpura or petechiae.  Neuro:  Normal gait. PERLA. EOMi. Alert. Oriented x3 Psych: Normal affect, dress and demeanor. Normal speech. Normal thought content and judgment.  No exam data present No results found. No results found for this or any previous visit (from the past 24 hour(s)).  Assessment/Plan: Jacob Aguirre is a 53 y.o. male present for OV for  Hematuria, unspecified type -No hematuria by repeat 12/15/2018.  Will repeat one additional time on his next follow-up to ensure no hematuria.  Mixed hyperlipidemia/Essential hypertension - will arrange for fasting lab appt. He is only taking omega-3  1000 mg at this time.  -Start Metamucil/fiber supplement 2-3 times a day - BP not at goal today. Will have re-check at his upcoming lab appt and if still elevated will need increase in med and/or follow up with his cardiologist.  - Fhx of recent stroke in his father.  Arthritis/knee pain:  Mobic is working well. Elevated LFTs -Discussed labs with him today and off Voltaren his liver enzymes dropped medically by half.  However he has had continued elevation dating back as far as 89.  We discussed work-up surrounding his elevated liver enzymes and he is agreeable to further studies today. -HIV, hepatitis panel, anti-smooth muscle antibody. -Ultrasound abdomen -We will see him back in 4 weeks for repeat liver enzymes and cholesterol panel now that he is me the dietary changes, lost some weight and is exercising.  We will still need to add back some statin for CV coverage with caution on LFTs.  Thrombocytopenia (HCC) Very mild thrombocytopenia at 142.  Will monitor.    Reviewed expectations re: course of current medical issues.  Discussed self-management of symptoms.  Outlined signs and symptoms indicating need for more acute intervention.  Patient verbalized understanding and all questions were answered.  Patient received an After-Visit Summary.    No orders of the defined types were placed in this encounter.    Note is dictated utilizing voice recognition software. Although note has been proof read prior to signing, occasional typographical errors still can be missed. If any questions arise, please do not hesitate to call for verification.   electronically signed by:  Howard Pouch, DO  [place]

## 2019-01-25 NOTE — Patient Instructions (Signed)
Continue fish oil supplement.  Start metamucil three times a day.   We will call you with lab results and the Ultrasound results.  Lab appt in 4 weeks for rpt liver enzyme test and cholesterol tests. We will hopefully then add back cholesterol medicine.   Keep up the good work on diet.      High Triglycerides Eating Plan Triglycerides are a type of fat in the blood. High levels of triglycerides can increase your risk of heart disease and stroke. If your triglyceride levels are high, choosing the right foods can help lower your triglycerides and keep your heart healthy. Work with your health care provider or a diet and nutrition specialist (dietitian) to develop an eating plan that is right for you. What are tips for following this plan? General guidelines   Lose weight, if you are overweight. For most people, losing 5-10 lbs (2-5 kg) helps lower triglyceride levels. A weight-loss plan may include. ? 30 minutes of exercise at least 5 days a week. ? Reducing the amount of calories, sugar, and fat you eat.  Eat a wide variety of fresh fruits, vegetables, and whole grains. These foods are high in fiber.  Eat foods that contain healthy fats, such as fatty fish, nuts, seeds, and olive oil.  Avoid foods that are high in added sugar, added salt (sodium), saturated fat, and trans fat.  Avoid low-fiber, refined carbohydrates such as white bread, crackers, noodles, and white rice.  Avoid foods with partially hydrogenated oils (trans fats), such as fried foods or stick margarine.  Limit alcohol intake to no more than 1 drink a day for nonpregnant women and 2 drinks a day for men. One drink equals 12 oz of beer, 5 oz of wine, or 1 oz of hard liquor. Your health care provider may recommend that you drink less depending on your overall health. Reading food labels  Check food labels for the amount of saturated fat. Choose foods with no or very little saturated fat.  Check food labels for the  amount of trans fat. Choose foods with no trans fat.  Check food labels for the amount of cholesterol. Choose foods low in cholesterol. Ask your dietitian how much cholesterol you should have each day.  Check food labels for the amount of sodium. Choose foods with less than 140 milligrams (mg) per serving. Shopping  Buy dairy products labeled as nonfat (skim) or low-fat (1%).  Avoid buying processed or prepackaged foods. These are often high in added sugar, sodium, and fat. Cooking  Choose healthy fats when cooking, such as olive oil or canola oil.  Cook foods using lower fat methods, such as baking, broiling, boiling, or grilling.  Make your own sauces, dressings, and marinades when possible, instead of buying them. Store-bought sauces, dressings, and marinades are often high in sodium and sugar. Meal planning  Eat more home-cooked food and less restaurant, buffet, and fast food.  Eat fatty fish at least 2 times each week. Examples of fatty fish include salmon, trout, mackerel, tuna, and herring.  If you eat whole eggs, do not eat more than 3 egg yolks per week. What foods are recommended? The items listed may not be a complete list. Talk with your dietitian about what dietary choices are best for you. Grains Whole wheat or whole grain breads, crackers, cereals, and pasta. Unsweetened oatmeal. Bulgur. Barley. Quinoa. Brown rice. Whole wheat flour tortillas. Vegetables Fresh or frozen vegetables. Low-sodium canned vegetables. Fruits All fresh, canned (in natural juice), or  frozen fruits. Meats and other protein foods Skinless chicken or [place]. Ground chicken or [place]. Lean cuts of pork, trimmed of fat. Fish and seafood, especially salmon, trout, and herring. Egg whites. Dried beans, peas, or lentils. Unsalted nuts or seeds. Unsalted canned beans. Natural peanut or almond butter. Dairy Low-fat dairy products. Skim or low-fat (1%) milk. Reduced fat (2%) and low-sodium cheese.  Low-fat ricotta cheese. Low-fat cottage cheese. Plain, low-fat yogurt. Fats and oils Tub margarine without trans fats. Light or reduced-fat mayonnaise. Light or reduced-fat salad dressings. Avocado. Safflower, olive, sunflower, soybean, and canola oils. What foods are not recommended? The items listed may not be a complete list. Talk with your dietitian about what dietary choices are best for you. Grains White bread. White (regular) pasta. White rice. Cornbread. Bagels. Pastries. Crackers that contain trans fat. Vegetables Creamed or fried vegetables. Vegetables in a cheese sauce. Fruits Sweetened dried fruit. Canned fruit in syrup. Fruit juice. Meats and other protein foods Fatty cuts of meat. Ribs. Chicken wings. Berniece Salines. Sausage. Bologna. Salami. Chitterlings. Fatback. Hot dogs. Bratwurst. Packaged lunch meats. Dairy Whole or reduced-fat (2%) milk. Half-and-half. Cream cheese. Full-fat or sweetened yogurt. Full-fat cheese. Nondairy creamers. Whipped toppings. Processed cheese or cheese spreads. Cheese curds. Beverages Alcohol. Sweetened drinks, such as soda, lemonade, fruit drinks, or punches. Fats and oils Butter. Stick margarine. Lard. Shortening. Ghee. Bacon fat. Tropical oils, such as coconut, palm kernel, or palm oils. Sweets and desserts Corn syrup. Sugars. Honey. Molasses. Candy. Jam and jelly. Syrup. Sweetened cereals. Cookies. Pies. Cakes. Donuts. Muffins. Ice cream. Condiments Store-bought sauces, dressings, and marinades that are high in sugar, such as ketchup and barbecue sauce. Summary  High levels of triglycerides can increase the risk of heart disease and stroke. Choosing the right foods can help lower your triglycerides.  Eat plenty of fresh fruits, vegetables, and whole grains. Choose low-fat dairy and lean meats. Eat fatty fish at least twice a week.  Avoid processed and prepackaged foods with added sugar, sodium, saturated fat, and trans fat.  If you need  suggestions or have questions about what types of food are good for you, talk with your health care provider or a dietitian. This information is not intended to replace advice given to you by your health care provider. Make sure you discuss any questions you have with your health care provider. Document Released: 12/18/2003 Document Revised: 02/11/2017 Document Reviewed: 05/04/2016 Elsevier Patient Education  2020 [employer].

## 2019-01-29 ENCOUNTER — Encounter: Payer: Self-pay | Admitting: Family Medicine

## 2019-01-29 DIAGNOSIS — M1710 Unilateral primary osteoarthritis, unspecified knee: Secondary | ICD-10-CM | POA: Insufficient documentation

## 2019-01-30 LAB — HEPATITIS PANEL, ACUTE
Hep A IgM: NONREACTIVE
Hep B C IgM: NONREACTIVE
Hepatitis B Surface Ag: NONREACTIVE
Hepatitis C Ab: NONREACTIVE
SIGNAL TO CUT-OFF: 0.01 (ref <1.00)

## 2019-01-30 LAB — HIV ANTIBODY (ROUTINE TESTING W REFLEX): HIV 1&2 Ab, 4th Generation: NONREACTIVE

## 2019-01-30 LAB — ANTI-SMOOTH MUSCLE ANTIBODY, IGG: Actin (Smooth Muscle) Antibody (IGG): <20 U (ref <20)

## 2019-02-07 ENCOUNTER — Ambulatory Visit (INDEPENDENT_AMBULATORY_CARE_PROVIDER_SITE_OTHER): Payer: BC Managed Care – PPO

## 2019-02-07 ENCOUNTER — Other Ambulatory Visit: Payer: Self-pay

## 2019-02-07 DIAGNOSIS — R7989 Other specified abnormal findings of blood chemistry: Secondary | ICD-10-CM

## 2019-02-07 DIAGNOSIS — R748 Abnormal levels of other serum enzymes: Secondary | ICD-10-CM | POA: Diagnosis not present

## 2019-02-15 DIAGNOSIS — E7800 Pure hypercholesterolemia, unspecified: Secondary | ICD-10-CM | POA: Diagnosis not present

## 2019-02-15 DIAGNOSIS — I10 Essential (primary) hypertension: Secondary | ICD-10-CM | POA: Diagnosis not present

## 2019-02-15 DIAGNOSIS — Z713 Dietary counseling and surveillance: Secondary | ICD-10-CM | POA: Diagnosis not present

## 2019-02-15 DIAGNOSIS — E669 Obesity, unspecified: Secondary | ICD-10-CM | POA: Diagnosis not present

## 2019-05-10 IMAGING — MR MR KNEE*R* W/O CM
4 of 7 series · 23 of 40 positions shown · non-contrast
Comparison: None.

CLINICAL DATA: Chronic right knee pain.  No recent injury.

EXAM:
MRI OF THE RIGHT KNEE WITHOUT CONTRAST
TECHNIQUE: Multiplanar, multisequence MR imaging of the knee was performed. No
intravenous contrast was administered.

[Series 4: T1 · coronal · 4.0mm · 0.29mm/px · 5 of 29 slices shown]
[im 1/29]
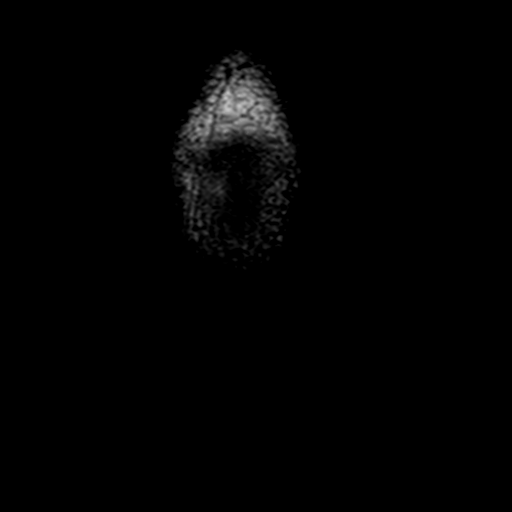
[im 6/29]
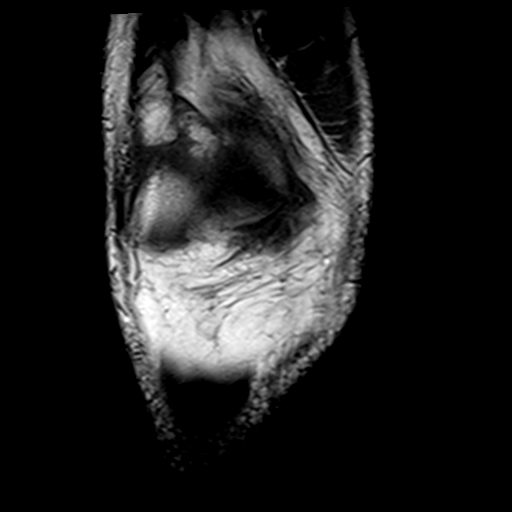
[im 12/29]
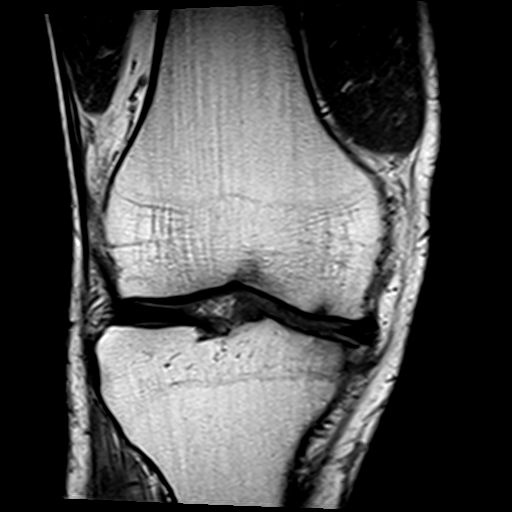
[im 17/29]
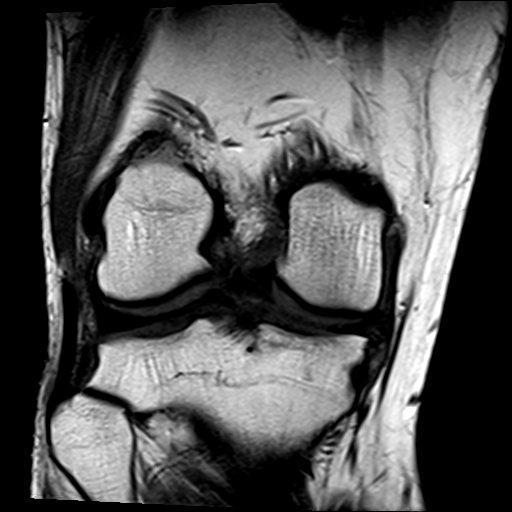
[im 29/29]
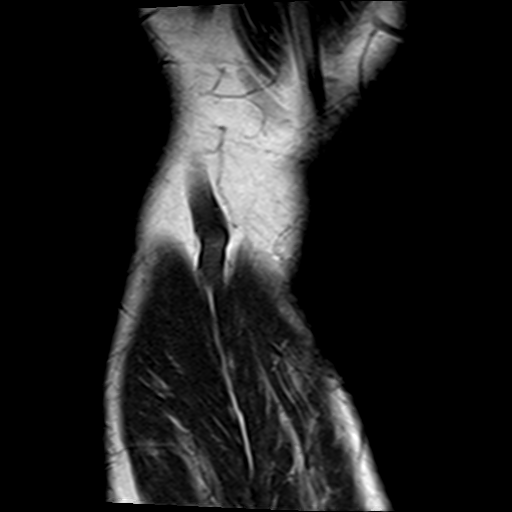

[Series 5: T2 fat-sat · coronal · 4.0mm · 0.59mm/px · 6 of 29 slices shown (1 of 2)]
[im 1/29]
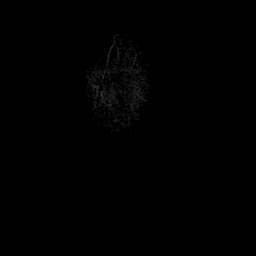
[im 6/29]
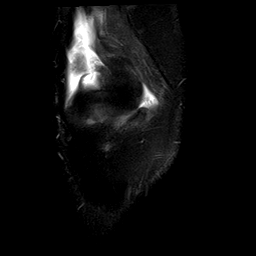
[im 12/29]
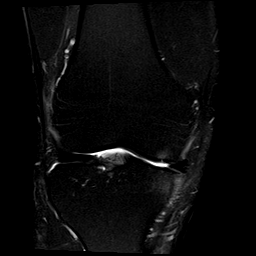
[im 17/29]
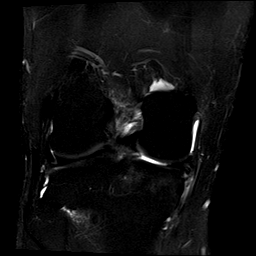
[im 23/29]
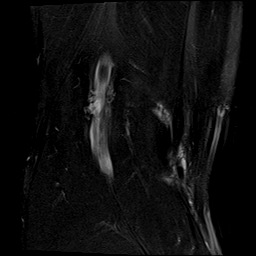
[im 29/29]
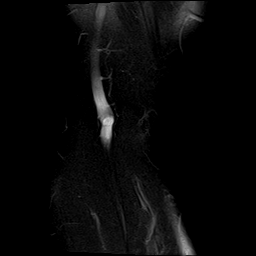

[Series 8: T2 fat-sat · sagittal · 3.0mm · 0.29mm/px · 6 of 31 slices shown (2 of 2)]
[im 1/31]
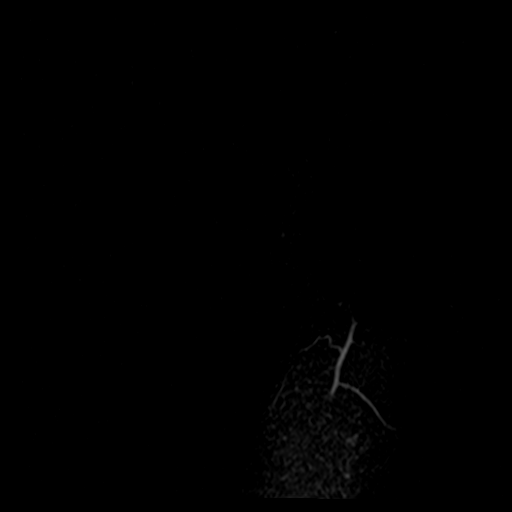
[im 7/31]
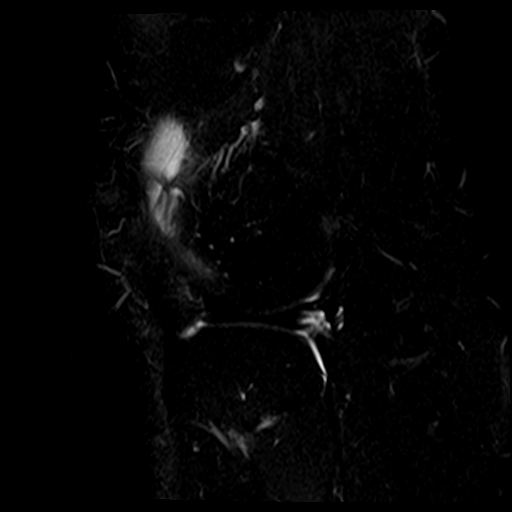
[im 13/31]
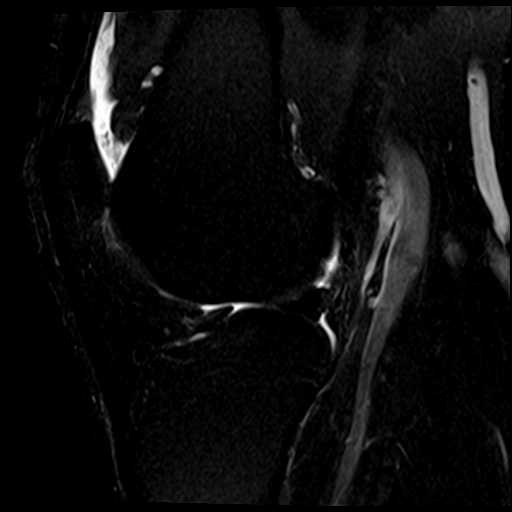
[im 19/31]
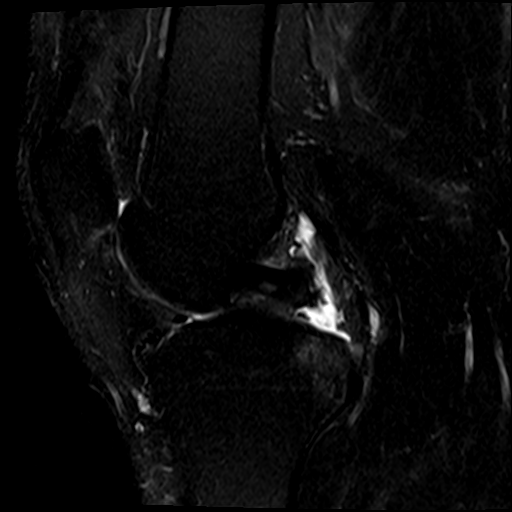
[im 25/31]
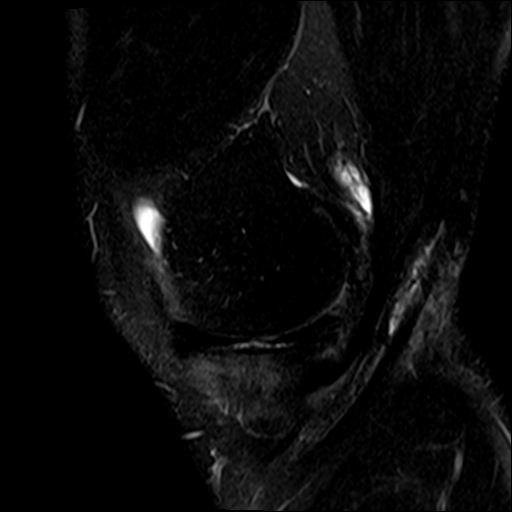
[im 31/31]
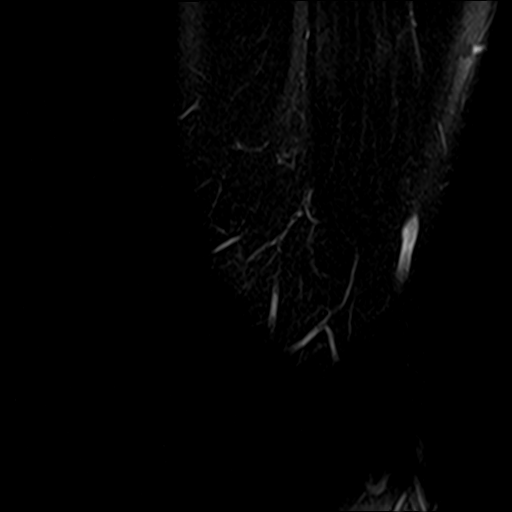

[Series 10: PD fat-sat · sagittal · 3.0mm · 0.29mm/px · 6 of 31 slices shown]
[im 1/31]
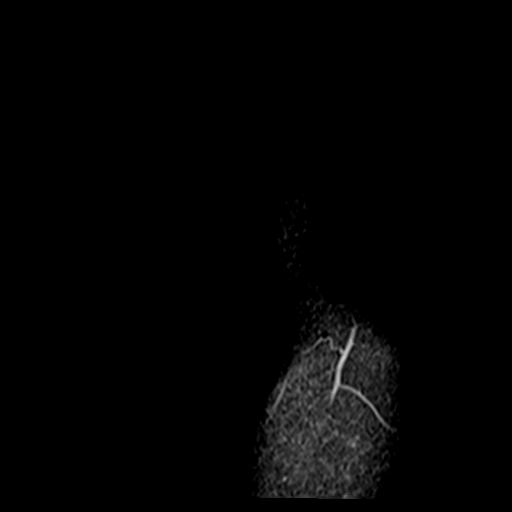
[im 7/31]
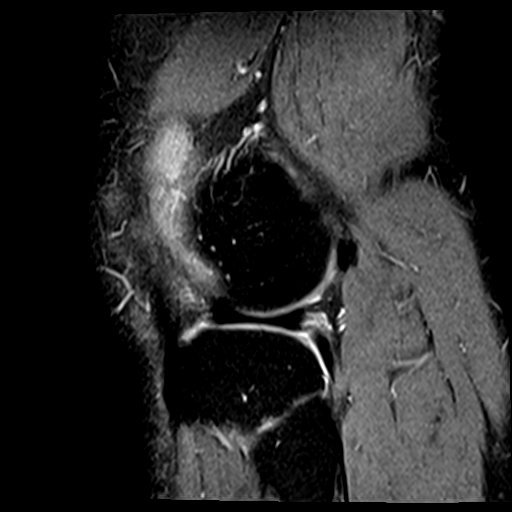
[im 13/31]
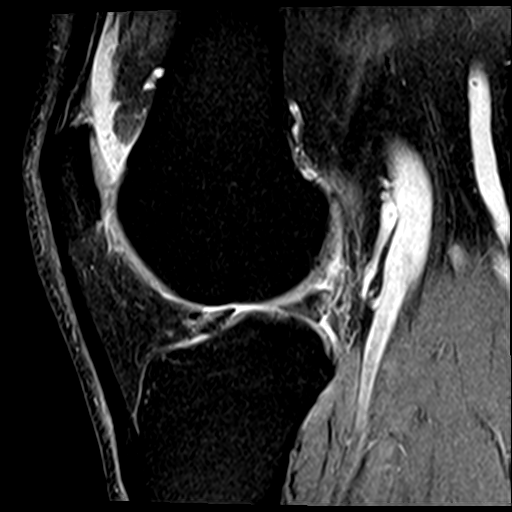
[im 19/31]
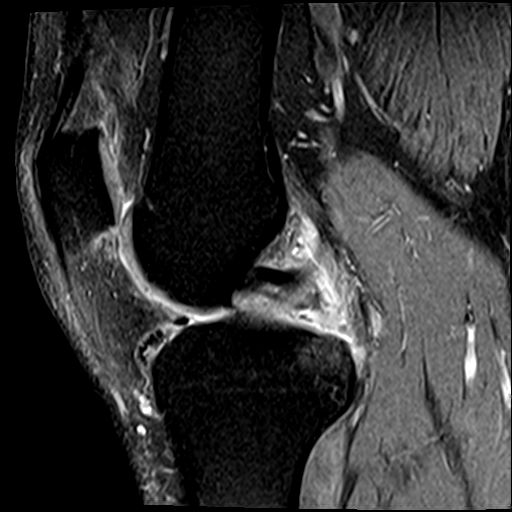
[im 25/31]
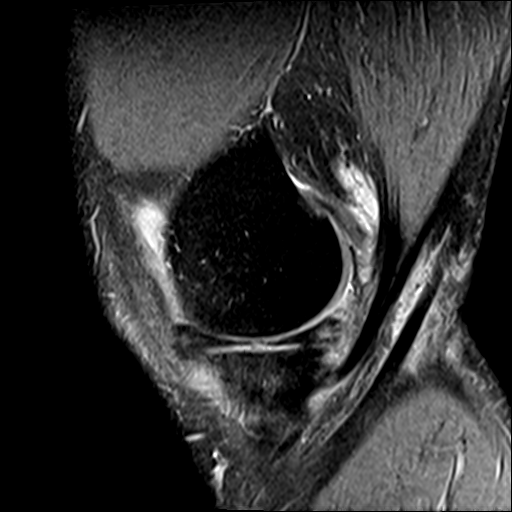
[im 31/31]
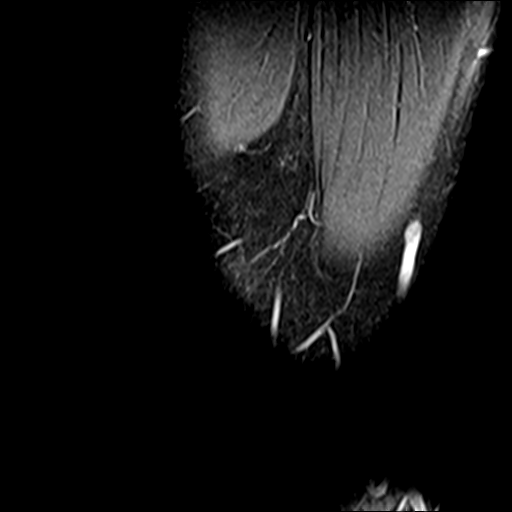

[23 of 40 positions shown; findings below may reference images not displayed]

FINDINGS: MENISCI

Medial meniscus: Large radial tear through the root of the posterior
horn is identified. Extensive degenerative signal is seen throughout
the remainder of the posterior horn and into the body.

Lateral meniscus:  Intact.

LIGAMENTS

Cruciates:  Intact.

Collaterals:  Intact.

CARTILAGE

Patellofemoral:  Preserved.

Medial: Cartilage along the medial tibia is markedly thinned with
underlying subchondral edema.

Lateral:  Preserved.

Joint:  Small effusion.

Popliteal Fossa:  Small Baker's cyst.

Extensor Mechanism:  Intact.

Bones:  No fracture or worrisome lesion.

Other: None.
IMPRESSION: Large radial tear root of the posterior horn of the medial meniscus.
Degenerative signal throughout the remainder of the posterior horn
extends into the meniscal body.

Moderate to moderately severe medial compartment osteoarthritis. The
patellofemoral and lateral compartments are spared.

Small Baker's cyst.

## 2019-06-11 ENCOUNTER — Other Ambulatory Visit: Payer: Self-pay

## 2019-06-11 MED ORDER — CLOBETASOL PROPIONATE 0.05 % EX CREA: 1.0000 "application " | TOPICAL_CREAM | CUTANEOUS | 1 refills | Status: DC

## 2019-06-28 ENCOUNTER — Other Ambulatory Visit: Payer: Self-pay | Admitting: Physician Assistant

## 2019-06-29 ENCOUNTER — Telehealth: Payer: Self-pay

## 2019-06-29 MED ORDER — MELOXICAM 15 MG PO TABS: 15.0000 mg | ORAL_TABLET | Freq: Every day | ORAL | 1 refills | Status: DC

## 2019-06-29 NOTE — Telephone Encounter (Signed)
Patient refill request  meloxicam (MOBIC) 15 MG tablet [id number]    [facility]

## 2019-06-29 NOTE — Telephone Encounter (Signed)
RF request for Mobic  LOV: 01/25/2019 Next ov: Not scheduled  Last written: 12/26/2018 #90 x1 refill   Please advise

## 2019-06-29 NOTE — Addendum Note (Signed)
Addended by: Howard Pouch A on: 06/29/2019 12:47 PM   Modules accepted: Orders

## 2019-06-29 NOTE — Telephone Encounter (Signed)
Pt was called and given information, he was scheduled for next week and verbalizes understanding that he will need to be seen for anymore refills

## 2019-06-29 NOTE — Telephone Encounter (Addendum)
Refill of Mobic for him.  However he needs his follow-up appointment scheduled for his chronic medical conditions, which was last completed December 26, 2018.  This is why he ran out of his medicine-he is to be seen for his chronic conditions every 5.5 months before running out of medications.   He will likely run out of his lisinopril very shortly as well so please make sure he gets on the schedule soon as possible.

## 2019-07-05 ENCOUNTER — Other Ambulatory Visit: Payer: Self-pay

## 2019-07-05 ENCOUNTER — Ambulatory Visit (INDEPENDENT_AMBULATORY_CARE_PROVIDER_SITE_OTHER): Payer: 59 | Admitting: Family Medicine

## 2019-07-05 ENCOUNTER — Encounter: Payer: Self-pay | Admitting: Family Medicine

## 2019-07-05 VITALS — BP 150/90 | HR 54 | Temp 98.1°F | Resp 16 | Ht 71.0 in | Wt 244.5 lb

## 2019-07-05 DIAGNOSIS — E782 Mixed hyperlipidemia: Secondary | ICD-10-CM | POA: Diagnosis not present

## 2019-07-05 DIAGNOSIS — K760 Fatty (change of) liver, not elsewhere classified: Secondary | ICD-10-CM | POA: Insufficient documentation

## 2019-07-05 DIAGNOSIS — R7989 Other specified abnormal findings of blood chemistry: Secondary | ICD-10-CM

## 2019-07-05 DIAGNOSIS — R319 Hematuria, unspecified: Secondary | ICD-10-CM

## 2019-07-05 DIAGNOSIS — M1710 Unilateral primary osteoarthritis, unspecified knee: Secondary | ICD-10-CM

## 2019-07-05 DIAGNOSIS — D696 Thrombocytopenia, unspecified: Secondary | ICD-10-CM | POA: Diagnosis not present

## 2019-07-05 DIAGNOSIS — I10 Essential (primary) hypertension: Secondary | ICD-10-CM

## 2019-07-05 LAB — URINALYSIS, ROUTINE W REFLEX MICROSCOPIC
Bilirubin Urine: NEGATIVE
Ketones, ur: NEGATIVE
Leukocytes,Ua: NEGATIVE
Nitrite: NEGATIVE
Specific Gravity, Urine: >=1.03 — AB (ref 1.000–1.030)
Total Protein, Urine: NEGATIVE
Urine Glucose: NEGATIVE
Urobilinogen, UA: 0.2 (ref 0.0–1.0)
pH: 5.5 (ref 5.0–8.0)

## 2019-07-05 LAB — LIPID PANEL
Cholesterol: 200 mg/dL (ref 0–200)
HDL: 32.6 mg/dL — ABNORMAL LOW (ref >39.00)
NonHDL: 166.93
Total CHOL/HDL Ratio: 6
Triglycerides: 311 mg/dL — ABNORMAL HIGH (ref 0.0–149.0)
VLDL: 62.2 mg/dL — ABNORMAL HIGH (ref 0.0–40.0)

## 2019-07-05 LAB — CBC
HCT: 49.7 % (ref 39.0–52.0)
Hemoglobin: 16.8 g/dL (ref 13.0–17.0)
MCHC: 33.7 g/dL (ref 30.0–36.0)
MCV: 90.5 fl (ref 78.0–100.0)
Platelets: 119 10*3/uL — ABNORMAL LOW (ref 150.0–400.0)
RBC: 5.5 Mil/uL (ref 4.22–5.81)
RDW: 13.6 % (ref 11.5–15.5)
WBC: 6.5 10*3/uL (ref 4.0–10.5)

## 2019-07-05 LAB — LDL CHOLESTEROL, DIRECT: Direct LDL: 98 mg/dL

## 2019-07-05 LAB — COMPREHENSIVE METABOLIC PANEL
ALT: 32 U/L (ref 0–53)
AST: 25 U/L (ref 0–37)
Albumin: 4.5 g/dL (ref 3.5–5.2)
Alkaline Phosphatase: 39 U/L (ref 39–117)
BUN: 23 mg/dL (ref 6–23)
CO2: 29 mEq/L (ref 19–32)
Calcium: 9.3 mg/dL (ref 8.4–10.5)
Chloride: 103 mEq/L (ref 96–112)
Creatinine, Ser: 0.86 mg/dL (ref 0.40–1.50)
GFR: 93.43 mL/min (ref >60.00)
Glucose, Bld: 85 mg/dL (ref 70–99)
Potassium: 4.3 mEq/L (ref 3.5–5.1)
Sodium: 140 mEq/L (ref 135–145)
Total Bilirubin: 0.8 mg/dL (ref 0.2–1.2)
Total Protein: 6.7 g/dL (ref 6.0–8.3)

## 2019-07-05 MED ORDER — LISINOPRIL 40 MG PO TABS: 40.0000 mg | ORAL_TABLET | Freq: Every day | ORAL | 0 refills | Status: DC

## 2019-07-05 NOTE — Progress Notes (Signed)
Jacob Aguirre , 09/06/1965, 54 y.o., male MRN: [id number] Patient Care Team    Relationship Specialty Notifications Start End  Ma Hillock, DO PCP - General Family Medicine  07/12/17   Nahser, Wonda Cheng, MD  Cardiology  08/27/11   Deneise Lever, MD Consulting Physician Pulmonary Disease  07/12/17     Chief Complaint  Patient presents with  . Hypertension    Pt is doing well with no complaints. Does not check BP at home. Pt took BP med at 6am this morning.      Subjective: Pt presents for an OV to follow-up on [facility] Hypertension/hyperlipidemia: Pt reports compliance with lisinopril 30 mg daily. Blood pressures ranges at home have been elevated mildly.  He states he had to have a physical through work and his blood pressure was elevated until he relaxed and lay down and they were able to get his blood pressure down to 138/80. Patient denies chest pain, shortness of breath, dizziness or lower extremity edema.  Pt does not take a daily baby ASA.  Patient was prescribed a statin, it was held temporarily secondary to elevated liver enzymes.  He had not restarted Lipitor as of yet BMP: Collected today CBC: Elected today RF: Hypertension, hypertriglyceridemia, obesity.  Family history of stroke in his father.  Hepatic steatosis/elevated liver enzymes/thrombocytopenia:  Elevated liver enzymes reported by his health at work labs.  After receiving his health at work papers we discontinued his Voltaren and Lipitor.  Timeline suggest Voltaren may have increased his LFTs in 2019, however they had been mildly elevated even up into that point.  He does report he is taking omega-3's now for his cholesterol.  His father recently had a stroke and patient will benefit from adding back on cholesterol medicine for CV protection when able.  He states he has started a Mediterranean diet, he is no longer eating red meat.  He has started the Mobic in place of the Voltaren and feels it is working rather well to  even better.  Hematuria: Prior note: complaints of reports of a hematuria found during a physical for the past 4 years through his work. PSA is also checked by [facility] and normal. He denies personal h/o kidney stones, but reorts his father has had many.He also reports his triglycerides are reported as high. Prior smoker - quit 1992- only 3 yr smoker. His weight has increased, he is not getting as much exercise and is having increased food cravings. He is interested in trying a medication for appetite suppression.  Arthritis: Patient reports the Mobic has been working rather well for him.  He states is actually better than the Voltaren was. Depression screen [facility] 2/9 01/25/2019 07/12/2017  Decreased Interest 0 0  Down, Depressed, Hopeless 0 0  PHQ - 2 Score 0 0    Allergies  Allergen Reactions  . Erythromycin   . Niacin And Related     Hives  . Penicillins     Causes Hives   Social History   Social History Narrative   Married.   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    Nonsmoker.   Drinks caffeine.    Smoke alarm in the home. Wears her seatbelt.    Owns firearms.    Feels safe in his relationships.    Past Medical History:  Diagnosis Date  . Gout    Right large toe  . Hematuria    pt reports chronic.   Marland Kitchen HTN (hypertension)    Dr.  Nahser  . Hypercholesterolemia   . Meniscal injury 1990  . OSA (obstructive sleep apnea)    wears CPAP; Dr. Annamaria Boots  . Sleep apnea    cpap  . Tinea corporis    Past Surgical History:  Procedure Laterality Date  . FINGER SURGERY    . KNEE ARTHROSCOPY W/ MENISCAL REPAIR Right 1990  . KNEE SURGERY     Broken Knee Cap  . pilonidal cyst resection    . POPLITEAL SYNOVIAL CYST EXCISION Right    3rd grade   Family History  Problem Relation Age of Onset  . Heart murmur Father   . Hearing loss Father   . Colon polyps Father   . Stroke Father   . Hypertension Mother   . COPD Mother   . Testicular cancer Brother 88       And prostate cancer  .  Colon cancer Neg Hx   . Esophageal cancer Neg Hx   . Rectal cancer Neg Hx   . Stomach cancer Neg Hx    Allergies as of 07/05/2019      Reactions   Erythromycin    Niacin And Related    Hives   Penicillins    Causes Hives      Medication List       Accurate as of July 05, 2019  3:30 PM. If you have any questions, ask your nurse or doctor.        STOP taking these medications   ciclopirox 0.77 % cream Commonly known as: LOPROX Stopped by: Howard Pouch, DO   clotrimazole 1 % cream Commonly known as: LOTRIMIN Stopped by: Howard Pouch, DO     TAKE these medications   alclomethasone 0.05 % cream Commonly known as: ACLOVATE APPLY TO AFFECTED AREA EVERY DAY TO TWICE A DAY   atorvastatin 40 MG tablet Commonly known as: LIPITOR TAKE 1 TABLET BY MOUTH ONCE DAILY   clobetasol cream 0.05 % Commonly known as: TEMOVATE Apply 1 application topically 1 day or 1 dose. Apply once to twice daily   FLAXSEED OIL PO Take by mouth.   lisinopril 40 MG tablet Commonly known as: ZESTRIL Take 1 tablet (40 mg total) by mouth daily. What changed:   medication strength  how much to take Changed by: Howard Pouch, DO   meloxicam 15 MG tablet Commonly known as: MOBIC Take 1 tablet (15 mg total) by mouth daily.   OMEGA 3 PO Take 1 tablet by mouth 3 (three) times daily.       All past medical history, surgical history, allergies, family history, immunizations andmedications were updated in the EMR today and reviewed under the history and medication portions of their EMR.     ROS: Negative, with the exception of above mentioned in HPI   Objective:  BP (!) 150/90 (BP Location: Left Arm, Patient Position: Sitting, Cuff Size: Normal)   Pulse (!) 54   Temp 98.1 F (36.7 C) (Temporal)   Resp 16   Ht 5' 11" (1.803 m)   Wt 244 lb 8 oz (110.9 kg)   SpO2 100%   BMI 34.10 kg/m  Body mass index is 34.1 kg/m. Gen: Afebrile. No acute distress.  HENT: AT. Tangent.  Eyes:Pupils Equal  Round Reactive to light, Extraocular movements intact,  Conjunctiva without redness, discharge or icterus. Neck/lymp/endocrine: Supple, no lymphadenopathy, no thyromegaly CV: RRR no murmur, no edema Chest: CTAB, no wheeze or crackles Neuro:  Normal gait. PERLA. EOMi. Alert. Oriented x3  Psych: Normal affect, dress  and demeanor. Normal speech. Normal thought content and judgment.   No exam data present No results found. No results found for this or any previous visit (from the past 24 hour(s)).  Assessment/Plan: VIKTOR PHILIPP is a 54 y.o. male present for OV for  Hematuria, unspecified type/thrombocytopenia -No hematuria by repeat 12/15/2018.   -We will repeat urinalysis one additional time to ensure resolution.   -CBC collected today   Mixed hyperlipidemia/Essential hypertension/morbid obesity Above goal. Increase lisinopril to 40 mg daily. -Continue omega-3 3000 mg  -Continue higher fiber diet/Metamucil -Hopefully will be able to have at least a low-dose statin for cardiovascular protection after liver enzymes repeated today. - Fhx of recent stroke in his father.  -CMP and lipid panel collected today.  Patient is fasting. Follow-up in 3.5 weeks for recheck on blood pressure on increased regimen.  May need to consider adding low-dose HCTZ versus amlodipine if not well controlled goal less than 130/80.  Arthritis/knee pain:   Stable. Continue Mobic 15 mg daily He does have mild thrombocytopenia we will need to monitor on chronic NSAID Follow-up 5.5 months.   Elevated LFTs/hepatic steatosis -Hepatic steatosis on ultrasound -HIV, hepatitis panel, anti-smooth muscle antibody negative. -CMP collected today has now been off Voltaren for 3 months and statin.  Will hopefully be able to add back at least a low-dose statin if needed, to provide him with cardiovascular protection secondary to his family history of stroke in his father.    Reviewed expectations re: course of current  medical issues.  Discussed self-management of symptoms.  Outlined signs and symptoms indicating need for more acute intervention.  Patient verbalized understanding and all questions were answered.  Patient received an After-Visit Summary.    Orders Placed This Encounter  Procedures  . Urinalysis, Routine w reflex microscopic  . CBC  . Comp Met (CMET)  . Lipid panel   Meds ordered this encounter  Medications  . lisinopril (ZESTRIL) 40 MG tablet    Sig: Take 1 tablet (40 mg total) by mouth daily.    Dispense:  30 tablet    Refill:  0    DC  Prior lisinopril scripts- dose change.   Referral Orders  No referral(s) requested today      Note is dictated utilizing voice recognition software. Although note has been proof read prior to signing, occasional typographical errors still can be missed. If any questions arise, please do not hesitate to call for verification.   electronically signed by:  Howard Pouch, DO  [place]

## 2019-07-05 NOTE — Patient Instructions (Addendum)
COVID-19 Vaccine Information can be found at: [email or web address] For questions related to vaccine distribution or appointments, please email [email or web address] or call [phone].  Covid Vaccine appointment go to [email or web address].   3.5 weeks for provider appt hypertension recheck.   We will call you with your lab results and guide you on restarting statin. You would benefit from at least a small dose of this medication for prevention.

## 2019-07-06 ENCOUNTER — Telehealth: Payer: Self-pay | Admitting: Family Medicine

## 2019-07-06 ENCOUNTER — Ambulatory Visit: Payer: BC Managed Care – PPO | Admitting: Family Medicine

## 2019-07-06 NOTE — Telephone Encounter (Signed)
Please inform patient the following information: -Liver and kidney function are normal.  His liver enzymes have completely normalized. -His cholesterol panel is actually improved, despite removing the statin medication.  Continue fish oil supplementation and fiber supplements for now.  His triglycerides are still mildly elevated at 311> we will touch base on this more at his follow-up appointment in 3-1/2 weeks. -His platelets, which are the clotting factors in the blood have been low in the past, but are lower than prior today at 119 and they are a larger size than normal.  -Urine does have a trace of blood present at this time.  For now, lets continue medications as he has been doing.  Not restart the statin yet.  And we will discuss all the lab results above in more detail at his follow-up.

## 2019-07-06 NOTE — Telephone Encounter (Signed)
Left message to return call and discuss lab results.

## 2019-07-09 NOTE — Telephone Encounter (Signed)
Pt had returned call after hours to get results on 07/06/2019.  Pt was called and message was left to return call

## 2019-07-09 NOTE — Telephone Encounter (Signed)
Pt returned call and was given lab results, he verbalized understanding. He has F/U appt scheduled

## 2019-07-25 ENCOUNTER — Other Ambulatory Visit: Payer: Self-pay | Admitting: Family Medicine

## 2019-07-25 DIAGNOSIS — I10 Essential (primary) hypertension: Secondary | ICD-10-CM

## 2019-07-25 NOTE — Telephone Encounter (Signed)
Patient has enough to last until appt on 5/19.

## 2019-07-27 ENCOUNTER — Ambulatory Visit: Payer: 59 | Admitting: Family Medicine

## 2019-08-01 ENCOUNTER — Ambulatory Visit (INDEPENDENT_AMBULATORY_CARE_PROVIDER_SITE_OTHER): Payer: 59 | Admitting: Family Medicine

## 2019-08-01 ENCOUNTER — Encounter: Payer: Self-pay | Admitting: Family Medicine

## 2019-08-01 ENCOUNTER — Other Ambulatory Visit: Payer: Self-pay

## 2019-08-01 VITALS — BP 158/118 | HR 55 | Temp 98.2°F | Resp 18 | Ht 71.0 in | Wt 246.2 lb

## 2019-08-01 DIAGNOSIS — Z9989 Dependence on other enabling machines and devices: Secondary | ICD-10-CM

## 2019-08-01 DIAGNOSIS — G4733 Obstructive sleep apnea (adult) (pediatric): Secondary | ICD-10-CM

## 2019-08-01 DIAGNOSIS — I10 Essential (primary) hypertension: Secondary | ICD-10-CM | POA: Diagnosis not present

## 2019-08-01 DIAGNOSIS — D691 Qualitative platelet defects: Secondary | ICD-10-CM | POA: Diagnosis not present

## 2019-08-01 DIAGNOSIS — D696 Thrombocytopenia, unspecified: Secondary | ICD-10-CM

## 2019-08-01 DIAGNOSIS — R319 Hematuria, unspecified: Secondary | ICD-10-CM

## 2019-08-01 DIAGNOSIS — E782 Mixed hyperlipidemia: Secondary | ICD-10-CM | POA: Diagnosis not present

## 2019-08-01 DIAGNOSIS — M1710 Unilateral primary osteoarthritis, unspecified knee: Secondary | ICD-10-CM

## 2019-08-01 LAB — CBC WITH DIFFERENTIAL/PLATELET
Basophils Absolute: 0 10*3/uL (ref 0.0–0.1)
Basophils Relative: 0.7 % (ref 0.0–3.0)
Eosinophils Absolute: 0.1 10*3/uL (ref 0.0–0.7)
Eosinophils Relative: 1.3 % (ref 0.0–5.0)
HCT: 47.9 % (ref 39.0–52.0)
Hemoglobin: 16.5 g/dL (ref 13.0–17.0)
Lymphocytes Relative: 28 % (ref 12.0–46.0)
Lymphs Abs: 1.8 10*3/uL (ref 0.7–4.0)
MCHC: 34.4 g/dL (ref 30.0–36.0)
MCV: 89.6 fl (ref 78.0–100.0)
Monocytes Absolute: 0.6 10*3/uL (ref 0.1–1.0)
Monocytes Relative: 8.7 % (ref 3.0–12.0)
Neutro Abs: 4 10*3/uL (ref 1.4–7.7)
Neutrophils Relative %: 61.3 % (ref 43.0–77.0)
Platelets: 108 10*3/uL — ABNORMAL LOW (ref 150.0–400.0)
RBC: 5.34 Mil/uL (ref 4.22–5.81)
RDW: 13.2 % (ref 11.5–15.5)
WBC: 6.5 10*3/uL (ref 4.0–10.5)

## 2019-08-01 MED ORDER — ATORVASTATIN CALCIUM 10 MG PO TABS: 10.0000 mg | ORAL_TABLET | Freq: Every day | ORAL | 3 refills | Status: DC

## 2019-08-01 MED ORDER — AMLODIPINE BESYLATE 5 MG PO TABS: 5.0000 mg | ORAL_TABLET | Freq: Every day | ORAL | 1 refills | Status: DC

## 2019-08-01 NOTE — Patient Instructions (Addendum)
Start Lipitor at 10 mg a day.  Continue lisinopril 40 mg. Start amlodipine 5 mg a day.   If platelets abnormal will refer you to a blood specialist.    Follow up in 4 weeks for recheck.

## 2019-08-01 NOTE — Progress Notes (Signed)
Jacob Aguirre , 22-Apr-1965, 54 y.o., male MRN: [id number] Patient Care Team    Relationship Specialty Notifications Start End  Ma Hillock, DO PCP - General Family Medicine  07/12/17   Nahser, Wonda Cheng, MD  Cardiology  08/27/11   Deneise Lever, MD Consulting Physician Pulmonary Disease  07/12/17     Chief Complaint  Patient presents with  . Hypertension    Does not check at home., Took BP meds at 6:15am     Subjective: Jacob Aguirre is a  54 y.o. Pt presents for an OV to follow-up on [facility] Hypertension/hyperlipidemia: Pt reports compliance with lisinopril 40 mg daily. He states he had to have a physical through work and his blood pressure was elevated until he relaxed and lay down and they were able to get his blood pressure down to 138/80.  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 Patient denies chest pain, shortness of breath, dizziness or lower extremity edema.  Labs up-to-date RF: Hypertension, hypertriglyceridemia, obesity.  Family history of stroke in his father.  Hepatic steatosis/elevated liver enzymes/thrombocytopenia:  Elevated liver enzymes reported by his health at work labs.  After receiving his health at work papers we discontinued his Voltaren and Lipitor.  Timeline suggest Voltaren may have increased his LFTs in 2019, however they had been mildly elevated even up into that point.  He does report he is taking omega-3's and increasing his fiber now for his cholesterol.  His repeat lipid panel last month looked great with no statin.  His father recently had a stroke and patient will benefit from adding back on cholesterol medicine for CV protection when able.  He states he has started a Mediterranean diet, he is no longer eating red meat.  He has started the Mobic in place of the Voltaren and feels it is working rather well to even better. His platelets had decreased 1 month ago and were commented on being enlarged.   This is a new finding for him.  Hematuria: Patient again had a trace of blood on his last urinalysis here 1 month ago.  Prior urinalysis was normal.  However he has a history of hematuria during a physical for the past 4 years through his work. PSA is also checked by [facility] and normal. He denies personal h/o kidney stones, but reorts his father has had many.Prior smoker - quit 1992- only 3 yr smoker.   Arthritis: Patient reports the Mobic has been working rather well for him.  He states is actually better than the Voltaren was. Depression screen [facility] 2/9 01/25/2019 07/12/2017  Decreased Interest 0 0  Down, Depressed, Hopeless 0 0  PHQ - 2 Score 0 0    Allergies  Allergen Reactions  . Erythromycin   . Niacin And Related     Hives  . Penicillins     Causes Hives   Social History   Social History Narrative   Married.   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    Nonsmoker.   Drinks caffeine.    Smoke alarm in the home. Wears her seatbelt.    Owns firearms.    Feels safe in his relationships.    Past Medical History:  Diagnosis Date  . Gout    Right large toe  . Hematuria    pt reports chronic.   Marland Kitchen HTN (hypertension)    Dr. Acie Fredrickson  . Hypercholesterolemia   . Meniscal injury 1990  .  OSA (obstructive sleep apnea)    wears CPAP; Dr. Annamaria Boots  . Sleep apnea    cpap  . Tinea corporis    Past Surgical History:  Procedure Laterality Date  . FINGER SURGERY    . KNEE ARTHROSCOPY W/ MENISCAL REPAIR Right 1990  . KNEE SURGERY     Broken Knee Cap  . pilonidal cyst resection    . POPLITEAL SYNOVIAL CYST EXCISION Right    3rd grade   Family History  Problem Relation Age of Onset  . Heart murmur Father   . Hearing loss Father   . Colon polyps Father   . Stroke Father   . Hypertension Mother   . COPD Mother   . Testicular cancer Brother 57       And prostate cancer  . Colon cancer Neg Hx   . Esophageal cancer Neg Hx   . Rectal cancer Neg Hx   . Stomach cancer Neg Hx    Allergies  as of 08/01/2019      Reactions   Erythromycin    Niacin And Related    Hives   Penicillins    Causes Hives      Medication List       Accurate as of Aug 01, 2019  5:14 PM. If you have any questions, ask your nurse or doctor.        alclomethasone 0.05 % cream Commonly known as: ACLOVATE APPLY TO AFFECTED AREA EVERY DAY TO TWICE A DAY   amLODipine 5 MG tablet Commonly known as: NORVASC Take 1 tablet (5 mg total) by mouth daily. Started by: Howard Pouch, DO   atorvastatin 10 MG tablet Commonly known as: LIPITOR Take 1 tablet (10 mg total) by mouth daily. What changed:   medication strength  how much to take Changed by: Howard Pouch, DO   clobetasol cream 0.05 % Commonly known as: TEMOVATE Apply 1 application topically 1 day or 1 dose. Apply once to twice daily   FLAXSEED OIL PO Take by mouth.   lisinopril 40 MG tablet Commonly known as: ZESTRIL Take 1 tablet (40 mg total) by mouth daily.   meloxicam 15 MG tablet Commonly known as: MOBIC Take 1 tablet (15 mg total) by mouth daily.   OMEGA 3 PO Take 1 tablet by mouth 3 (three) times daily.       All past medical history, surgical history, allergies, family history, immunizations andmedications were updated in the EMR today and reviewed under the history and medication portions of their EMR.     ROS: Negative, with the exception of above mentioned in HPI   Objective:  BP (!) 158/118 (BP Location: Left Arm, Patient Position: Sitting, Cuff Size: Normal)   Pulse (!) 55   Temp 98.2 F (36.8 C) (Temporal)   Resp 18   Ht 5\' 11"  (1.803 m)   Wt 246 lb 4 oz (111.7 kg)   SpO2 96%   BMI 34.34 kg/m  Body mass index is 34.34 kg/m. Gen: Afebrile. No acute distress.  Nontoxic in presentation, well-developed, well-nourished, obese male. HENT: AT. Jacob Aguirre.  Eyes:Pupils Equal Round Reactive to light, Extraocular movements intact,  Conjunctiva without redness, discharge or icterus. CV: RRR no murmur, no edema, +2/4 P  posterior tibialis pulses Chest: CTAB, no wheeze or crackles Skin: no rashes, purpura or petechiae.  Neuro:  Normal gait. PERLA. EOMi. Alert. Oriented x3  Psych: Normal affect, dress and demeanor. Normal speech. Normal thought content and judgment.  No exam data present No results  found. Results for orders placed or performed in visit on 08/01/19 (from the past 24 hour(s))  CBC w/Diff     Status: Abnormal   Collection Time: 08/01/19  8:40 AM  Result Value Ref Range   WBC 6.5 4.0 - 10.5 K/uL   RBC 5.34 4.22 - 5.81 Mil/uL   Hemoglobin 16.5 13.0 - 17.0 g/dL   HCT 47.9 39.0 - 52.0 %   MCV 89.6 78.0 - 100.0 fl   MCHC 34.4 30.0 - 36.0 g/dL   RDW 13.2 11.5 - 15.5 %   Platelets 108.0 (L) 150.0 - 400.0 K/uL   Neutrophils Relative % 61.3 43.0 - 77.0 %   Lymphocytes Relative 28.0 12.0 - 46.0 %   Monocytes Relative 8.7 3.0 - 12.0 %   Eosinophils Relative 1.3 0.0 - 5.0 %   Basophils Relative 0.7 0.0 - 3.0 %   Neutro Abs 4.0 1.4 - 7.7 K/uL   Lymphs Abs 1.8 0.7 - 4.0 K/uL   Monocytes Absolute 0.6 0.1 - 1.0 K/uL   Eosinophils Absolute 0.1 0.0 - 0.7 K/uL   Basophils Absolute 0.0 0.0 - 0.1 K/uL    Assessment/Plan: Jacob Aguirre is a 54 y.o. male present for OV for  Hematuria, unspecified type/thrombocytopenia -No hematuria by repeat 12/15/2018.  Trace present on 06/2019 labs.  Discussed referral to urology since it has been reportedly persistent for 4 years although patient states it is always only said trace.  He has a prior history of smoking temporarily which could put him at high risk.  Pt would like to wait on referral for now. - possibly related to OSA, elevated BP or plt d/o  Mixed hyperlipidemia/Essential hypertension/morbid obesity Above goal despite increase in lisinopril.  Continue lisinopril to 40 mg daily. Start amlodipine 5 mg qd Restart Lipitor at low-dose 10 mg nightly for CV protection with family history of stroke in his father.  We will need to watch LFTs after restart.  -Continue omega-3 3000 mg  -Continue higher fiber diet/Metamucil - Fhx of recent stroke in his father.  Follow-up in 4 weeks for recheck on blood pressure on increased regimen.   Arthritis/knee pain:  (doc only) Stable. Continue Mobic 15 mg daily He does have mild thrombocytopenia we will need to monitor on chronic NSAID.   Follow-up 5.5 months.   Elevated LFTs/hepatic steatosis>resolved off Voltaren and statin  -Hepatic steatosis on ultrasound -HIV, hepatitis panel, anti-smooth muscle antibody negative.  Thrombocytopenia/platelet enlargement: Repeat CBC today with pathologist review. Discussed with him if platelets again are found to be enlarged will refer to blood specialist/hematologist for further evaluation. Mild thrombocytopenia had been present prior to fish oil start and meloxicam.  However they have decreased slightly after starting both the above with "enlarged platelets.  "  OSA with CPAP: Resistant hypertension may be partially caused by his OSA/CPAP.  Although I would have expected his OSA to improve with his weight loss.  He has not seen his pulmonologist in some time for reevaluation.  We will refer back to his pulmonology team.   Reviewed expectations re: course of current medical issues.  Discussed self-management of symptoms.  Outlined signs and symptoms indicating need for more acute intervention.  Patient verbalized understanding and all questions were answered.  Patient received an After-Visit Summary.    Orders Placed This Encounter  Procedures  . CBC w/Diff  . Pathologist smear review  . Ambulatory referral to Pulmonology   Meds ordered this encounter  Medications  . amLODipine (NORVASC) 5 MG  tablet    Sig: Take 1 tablet (5 mg total) by mouth daily.    Dispense:  90 tablet    Refill:  1  . atorvastatin (LIPITOR) 10 MG tablet    Sig: Take 1 tablet (10 mg total) by mouth daily.    Dispense:  90 tablet    Refill:  3    Referral Orders      Ambulatory referral to Pulmonology    Note is dictated utilizing voice recognition software. Although note has been proof read prior to signing, occasional typographical errors still can be missed. If any questions arise, please do not hesitate to call for verification.   electronically signed by:  Howard Pouch, DO  [place]

## 2019-08-02 LAB — PATHOLOGIST SMEAR REVIEW

## 2019-08-03 ENCOUNTER — Telehealth: Payer: Self-pay | Admitting: Family Medicine

## 2019-08-03 DIAGNOSIS — D691 Qualitative platelet defects: Secondary | ICD-10-CM

## 2019-08-03 DIAGNOSIS — D696 Thrombocytopenia, unspecified: Secondary | ICD-10-CM

## 2019-08-03 DIAGNOSIS — R319 Hematuria, unspecified: Secondary | ICD-10-CM

## 2019-08-03 NOTE — Telephone Encounter (Signed)
Patient was called this morning with results to his CBC with differential and smear.  Progressively worsening, but mild thrombocytopenia with large platelets on smear. He has had chronic hematuria, which is possibly due to the above findings.  Discussed with him possible diagnoses including ITP.  Hematology referral placed, patient aware and appreciative of call.

## 2019-08-14 ENCOUNTER — Telehealth: Payer: Self-pay | Admitting: Hematology and Oncology

## 2019-08-14 NOTE — Telephone Encounter (Signed)
Received a new hem referral from Dr. Raoul Pitch for thrombocytopenia. Pt has been cld and scheduled to see Dr. Lindi Adie on 6/3 at 945am. Pt aware to arrive 15 minutes early.

## 2019-08-15 NOTE — Progress Notes (Signed)
[facility] CONSULT NOTE  Patient Care Team: Ma Hillock, DO as PCP - General (Family Medicine) Nahser, Wonda Cheng, MD (Cardiology) Deneise Lever, MD as Consulting Physician (Pulmonary Disease)  CHIEF COMPLAINTS/PURPOSE OF CONSULTATION:  Newly diagnosed thrombocytopenia  HISTORY OF PRESENTING ILLNESS:  Jacob Aguirre 54 y.o. male is here because of recent diagnosis of thrombocytopenia. He is referred by his PCP Dr. Raoul Pitch. Labs on 07/05/19 showed platelets 119, and on 08/01/19 108. He presents to the clinic today for initial evaluation.  He reports that he has obesity and hypertension and has been trying to lose weight but has been unsuccessful.  On a routine annual physical he was noted to have thrombocytopenia who was referred to [place] for evaluation.  I reviewed his records extensively and collaborated the history with the patient.  MEDICAL HISTORY:  Past Medical History:  Diagnosis Date  . Gout    Right large toe  . Hematuria    pt reports chronic.   Marland Kitchen HTN (hypertension)    Dr. Acie Fredrickson  . Hypercholesterolemia   . Meniscal injury 1990  . OSA (obstructive sleep apnea)    wears CPAP; Dr. Annamaria Boots  . Sleep apnea    cpap  . Tinea corporis     SURGICAL HISTORY: Past Surgical History:  Procedure Laterality Date  . FINGER SURGERY    . KNEE ARTHROSCOPY W/ MENISCAL REPAIR Right 1990  . KNEE SURGERY     Broken Knee Cap  . pilonidal cyst resection    . POPLITEAL SYNOVIAL CYST EXCISION Right    3rd grade    SOCIAL HISTORY: Social History   Socioeconomic History  . Marital status: Married    Spouse name: Not on file  . Number of children: Not on file  . Years of education: Not on file  . Highest education level: Not on file  Occupational History  . Occupation: IT trainer  Tobacco Use  . Smoking status: Former Smoker    Packs/day: 0.50    Years: 3.00    Pack years: 1.50    Types: Cigarettes    Quit date: 03/15/1990    Years since quitting: 29.4  .  Smokeless tobacco: Never Used  Substance and Sexual Activity  . Alcohol use: Yes    Alcohol/week: 0.0 standard drinks  . Drug use: No  . Sexual activity: Yes    Partners: Female    Comment: Married  Other Topics Concern  . Not on file  Social History Narrative   Married.   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    Nonsmoker.   Drinks caffeine.    Smoke alarm in the home. Wears her seatbelt.    Owns firearms.    Feels safe in his relationships.    Social Determinants of Health   Financial Resource Strain:   . Difficulty of Paying Living Expenses:   Food Insecurity:   . Worried About Charity fundraiser in the Last Year:   . Arboriculturist in the Last Year:   Transportation Needs:   . Film/video editor (Medical):   Marland Kitchen Lack of Transportation (Non-Medical):   Physical Activity:   . Days of Exercise per Week:   . Minutes of Exercise per Session:   Stress:   . Feeling of Stress :   Social Connections:   . Frequency of Communication with Friends and Family:   . Frequency of Social Gatherings with Friends and Family:   . Attends Religious Services:   .  Active Member of Clubs or Organizations:   . Attends Archivist Meetings:   Marland Kitchen Marital Status:   Intimate Partner Violence:   . Fear of Current or Ex-Partner:   . Emotionally Abused:   Marland Kitchen Physically Abused:   . Sexually Abused:     FAMILY HISTORY: Family History  Problem Relation Age of Onset  . Heart murmur Father   . Hearing loss Father   . Colon polyps Father   . Stroke Father   . Hypertension Mother   . COPD Mother   . Testicular cancer Brother 32       And prostate cancer  . Colon cancer Neg Hx   . Esophageal cancer Neg Hx   . Rectal cancer Neg Hx   . Stomach cancer Neg Hx     ALLERGIES:  is allergic to erythromycin; niacin and related; and penicillins.  MEDICATIONS:  Current Outpatient Medications  Medication Sig Dispense Refill  . alclomethasone (ACLOVATE) 0.05 % cream APPLY TO AFFECTED AREA  EVERY DAY TO TWICE A DAY 60 g 6  . amLODipine (NORVASC) 5 MG tablet Take 1 tablet (5 mg total) by mouth daily. 90 tablet 1  . atorvastatin (LIPITOR) 10 MG tablet Take 1 tablet (10 mg total) by mouth daily. 90 tablet 3  . clobetasol cream (TEMOVATE) [id number] % Apply 1 application topically 1 day or 1 dose. Apply once to twice daily 30 g 1  . Flaxseed, Linseed, (FLAXSEED OIL PO) Take by mouth.    Marland Kitchen lisinopril (ZESTRIL) 40 MG tablet Take 1 tablet (40 mg total) by mouth daily. 30 tablet 0  . meloxicam (MOBIC) 15 MG tablet Take 1 tablet (15 mg total) by mouth daily. 90 tablet 1  . Omega-3 Fatty Acids (OMEGA 3 PO) Take 1 tablet by mouth 3 (three) times daily.      No current facility-administered medications for this visit.    REVIEW OF SYSTEMS:   Constitutional: Denies fevers, chills or abnormal night sweats Eyes: Denies blurriness of vision, double vision or watery eyes Ears, nose, mouth, throat, and face: Denies mucositis or sore throat Respiratory: Denies cough, dyspnea or wheezes Cardiovascular: Denies palpitation, chest discomfort or lower extremity swelling Gastrointestinal:  Denies nausea, heartburn or change in bowel habits Skin: Denies abnormal skin rashes Lymphatics: Denies new lymphadenopathy or easy bruising Neurological:Denies numbness, tingling or new weaknesses Behavioral/Psych: Mood is stable, no new changes  All other systems were reviewed with the patient and are negative.  PHYSICAL EXAMINATION: ECOG PERFORMANCE STATUS: 1 - Symptomatic but completely ambulatory  Vitals:   08/16/19 0941  BP: (!) 155/92  Pulse: 61  Resp: 18  Temp: 99.1 F (37.3 C)  SpO2: 100%   Filed Weights   08/16/19 0941  Weight: 245 lb 12.8 oz (111.5 kg)    GENERAL:alert, no distress and comfortable SKIN: skin color, texture, turgor are normal, no rashes or significant lesions EYES: normal, conjunctiva are pink and non-injected, sclera clear OROPHARYNX:no exudate, no erythema and lips, buccal  mucosa, and tongue normal  NECK: supple, thyroid normal size, non-tender, without nodularity LYMPH:  no palpable lymphadenopathy in the cervical, axillary or inguinal LUNGS: clear to auscultation and percussion with normal breathing effort HEART: regular rate & rhythm and no murmurs and no lower extremity edema ABDOMEN:abdomen soft, non-tender and normal bowel sounds Musculoskeletal:no cyanosis of digits and no clubbing  PSYCH: alert & oriented x 3 with fluent speech NEURO: no focal motor/sensory deficits  LABORATORY DATA:  I have reviewed the data as listed  Lab Results  Component Value Date   WBC 6.5 08/01/2019   HGB 16.5 08/01/2019   HCT 47.9 08/01/2019   MCV 89.6 08/01/2019   PLT 108.0 (L) 08/01/2019   Lab Results  Component Value Date   NA 140 07/05/2019   K 4.3 07/05/2019   CL 103 07/05/2019   CO2 29 07/05/2019    RADIOGRAPHIC STUDIES: I have personally reviewed the radiological reports and agreed with the findings in the report.  ASSESSMENT AND PLAN:  Thrombocytopenia ([facility]) Lab review: 08/22/2018: Platelets 127 12/25/2018: Platelets 142 07/05/2019: Platelets 119 08/01/2019: Platelets 108 (large platelets seen)  Remainder of the CBC with differential was normal  Differential diagnosis: 1. Low-grade ITP: Very possible 2. medication induced: On further review patient is not taking any medications that are associated with thrombocytopenia 3. Bone marrow factors: With normal white count and hemoglobin I do not suspect a bone marrow disorder. 4. Hepatitis B/C: Negative (done previously) 5. Splenomegaly: Ultrasound of the liver and spleen done in 2020 showed fatty liver but no enlarged spleen.  I discussed with the patient that the level of thrombocytopenia is very mild and that these levels, there are usually no adverse effects. There is usually no risk of bleeding and hence it can be observed without making any changes to patient's medications or requiring any further  investigations like bone marrow biopsies.  I recommended watchful monitoring. We discussed at length about obesity management and weight loss and exercise as critical factors improving his overall health and his bone marrow function.  We discussed the role of intermittent fasting and walking 3 miles every day. He works as a Airline pilot at [employer].  He has a treadmill and will work on that and lose weight.  Return to clinic in 6 months with labs and follow-up.  All questions were answered. The patient knows to call the clinic with any problems, questions or concerns.   Rulon Eisenmenger, MD, MPH 08/16/2019    I, Molly Dorshimer, am acting as scribe for Nicholas Lose, MD.  I have reviewed the above documentation for accuracy and completeness, and I agree with the above.

## 2019-08-16 ENCOUNTER — Inpatient Hospital Stay: Payer: 59 | Attending: Hematology and Oncology | Admitting: Hematology and Oncology

## 2019-08-16 ENCOUNTER — Other Ambulatory Visit: Payer: Self-pay

## 2019-08-16 DIAGNOSIS — Z87891 Personal history of nicotine dependence: Secondary | ICD-10-CM | POA: Diagnosis not present

## 2019-08-16 DIAGNOSIS — E7800 Pure hypercholesterolemia, unspecified: Secondary | ICD-10-CM | POA: Diagnosis not present

## 2019-08-16 DIAGNOSIS — Z79899 Other long term (current) drug therapy: Secondary | ICD-10-CM | POA: Insufficient documentation

## 2019-08-16 DIAGNOSIS — M109 Gout, unspecified: Secondary | ICD-10-CM | POA: Insufficient documentation

## 2019-08-16 DIAGNOSIS — E669 Obesity, unspecified: Secondary | ICD-10-CM | POA: Insufficient documentation

## 2019-08-16 DIAGNOSIS — K760 Fatty (change of) liver, not elsewhere classified: Secondary | ICD-10-CM | POA: Diagnosis not present

## 2019-08-16 DIAGNOSIS — G4730 Sleep apnea, unspecified: Secondary | ICD-10-CM | POA: Diagnosis not present

## 2019-08-16 DIAGNOSIS — D696 Thrombocytopenia, unspecified: Secondary | ICD-10-CM | POA: Insufficient documentation

## 2019-08-16 DIAGNOSIS — I10 Essential (primary) hypertension: Secondary | ICD-10-CM | POA: Insufficient documentation

## 2019-08-16 NOTE — Assessment & Plan Note (Signed)
Lab review: 08/22/2018: Platelets 127 12/25/2018: Platelets 142 07/05/2019: Platelets 119 08/01/2019: Platelets 108 (large platelets seen)  Remainder of the CBC with differential was normal  Differential diagnosis: 1. Low-grade ITP 2. medication induced: On further review patient is not taking any medications that are associated with thrombocytopenia 3. Bone marrow factors 4. Hepatitis B/C: Negative (done previously) 5. Splenomegaly: We will check an ultrasound of the liver and spleen  I discussed with the patient that the level of thrombocytopenia is very mild and that these levels, there are usually no adverse effects. There is usually no risk of bleeding and hence it can be observed without making any changes to patient's medications or requiring any further investigations like bone marrow biopsies.  I recommended watchful monitoring. I will call the patient with the result of the liver and spleen ultrasound.

## 2019-08-17 ENCOUNTER — Telehealth: Payer: Self-pay | Admitting: Hematology and Oncology

## 2019-08-17 NOTE — Telephone Encounter (Signed)
Scheduled per los, patient has been called and voicemail has been left.

## 2019-08-30 ENCOUNTER — Other Ambulatory Visit: Payer: Self-pay | Admitting: Family Medicine

## 2019-08-30 DIAGNOSIS — I10 Essential (primary) hypertension: Secondary | ICD-10-CM

## 2019-09-07 ENCOUNTER — Other Ambulatory Visit: Payer: Self-pay | Admitting: Family Medicine

## 2019-09-07 DIAGNOSIS — I10 Essential (primary) hypertension: Secondary | ICD-10-CM

## 2019-09-11 ENCOUNTER — Encounter: Payer: Self-pay | Admitting: Family Medicine

## 2019-09-11 ENCOUNTER — Other Ambulatory Visit: Payer: Self-pay

## 2019-09-11 ENCOUNTER — Ambulatory Visit: Payer: 59 | Admitting: Family Medicine

## 2019-09-11 VITALS — BP 149/90 | HR 60 | Temp 98.3°F | Resp 17 | Ht 71.0 in | Wt 244.0 lb

## 2019-09-11 DIAGNOSIS — E782 Mixed hyperlipidemia: Secondary | ICD-10-CM | POA: Diagnosis not present

## 2019-09-11 DIAGNOSIS — R7989 Other specified abnormal findings of blood chemistry: Secondary | ICD-10-CM

## 2019-09-11 DIAGNOSIS — I10 Essential (primary) hypertension: Secondary | ICD-10-CM | POA: Diagnosis not present

## 2019-09-11 DIAGNOSIS — D696 Thrombocytopenia, unspecified: Secondary | ICD-10-CM

## 2019-09-11 MED ORDER — MELOXICAM 15 MG PO TABS: 15.0000 mg | ORAL_TABLET | Freq: Every day | ORAL | 1 refills | Status: DC

## 2019-09-11 MED ORDER — LISINOPRIL 40 MG PO TABS: 40.0000 mg | ORAL_TABLET | Freq: Every day | ORAL | 1 refills | Status: DC

## 2019-09-11 MED ORDER — AMLODIPINE BESYLATE 10 MG PO TABS
10.0000 mg | ORAL_TABLET | Freq: Every day | ORAL | 1 refills | Status: DC
Start: 2019-09-11 — End: 2020-03-06

## 2019-09-11 NOTE — Patient Instructions (Signed)
If you have not heard from [place] or pulm in 1 week please call in about referral.  Increase amlodipine to 10 mg a day. (new bottle will be 10 mg per pill).  Refilled lisinopril.     Sleep Apnea Sleep apnea affects breathing during sleep. It causes breathing to stop for a short time or to become shallow. It can also increase the risk of:  Heart attack.  Stroke.  Being very overweight (obese).  Diabetes.  Heart failure.  Irregular heartbeat. The goal of treatment is to help you breathe normally again. What are the causes? There are three kinds of sleep apnea:  Obstructive sleep apnea. This is caused by a blocked or collapsed airway.  Central sleep apnea. This happens when the brain does not send the right signals to the muscles that control breathing.  Mixed sleep apnea. This is a combination of obstructive and central sleep apnea. The most common cause of this condition is a collapsed or blocked airway. This can happen if:  Your throat muscles are too relaxed.  Your tongue and tonsils are too large.  You are overweight.  Your airway is too small. What increases the risk?  Being overweight.  Smoking.  Having a small airway.  Being older.  Being male.  Drinking alcohol.  Taking medicines to calm yourself (sedatives or tranquilizers).  Having family members with the condition. What are the signs or symptoms?  Trouble staying asleep.  Being sleepy or tired during the day.  Getting angry a lot.  Loud snoring.  Headaches in the morning.  Not being able to focus your mind (concentrate).  Forgetting things.  Less interest in sex.  Mood swings.  Personality changes.  Feelings of sadness (depression).  Waking up a lot during the night to pee (urinate).  Dry mouth.  Sore throat. How is this diagnosed?  Your medical history.  A physical exam.  A test that is done when you are sleeping (sleep study). The test is most often done in a sleep lab  but may also be done at home. How is this treated?   Sleeping on your side.  Using a medicine to get rid of mucus in your nose (decongestant).  Avoiding the use of alcohol, medicines to help you relax, or certain pain medicines (narcotics).  Losing weight, if needed.  Changing your diet.  Not smoking.  Using a machine to open your airway while you sleep, such as: ? An oral appliance. This is a mouthpiece that shifts your lower jaw forward. ? A CPAP device. This device blows air through a mask when you breathe out (exhale). ? An EPAP device. This has valves that you put in each nostril. ? A BPAP device. This device blows air through a mask when you breathe in (inhale) and breathe out.  Having surgery if other treatments do not work. It is important to get treatment for sleep apnea. Without treatment, it can lead to:  High blood pressure.  Coronary artery disease.  In men, not being able to have an erection (impotence).  Reduced thinking ability. Follow these instructions at home: Lifestyle  Make changes that your doctor recommends.  Eat a healthy diet.  Lose weight if needed.  Avoid alcohol, medicines to help you relax, and some pain medicines.  Do not use any products that contain nicotine or tobacco, such as cigarettes, e-cigarettes, and chewing tobacco. If you need help quitting, ask your doctor. General instructions  Take over-the-counter and prescription medicines only as told  by your doctor.  If you were given a machine to use while you sleep, use it only as told by your doctor.  If you are having surgery, make sure to tell your doctor you have sleep apnea. You may need to bring your device with you.  Keep all follow-up visits as told by your doctor. This is important. Contact a doctor if:  The machine that you were given to use during sleep bothers you or does not seem to be working.  You do not get better.  You get worse. Get help right away  if:  Your chest hurts.  You have trouble breathing in enough air.  You have an uncomfortable feeling in your back, arms, or stomach.  You have trouble talking.  One side of your body feels weak.  A part of your face is hanging down. These symptoms may be an emergency. Do not wait to see if the symptoms will go away. Get medical help right away. Call your local emergency services (911 in the [place] Do not drive yourself to the hospital. Summary  This condition affects breathing during sleep.  The most common cause is a collapsed or blocked airway.  The goal of treatment is to help you breathe normally while you sleep. This information is not intended to replace advice given to you by your health care provider. Make sure you discuss any questions you have with your health care provider. Document Revised: 12/16/2017 Document Reviewed: 10/25/2017 Elsevier Patient Education  [place].

## 2019-09-11 NOTE — Progress Notes (Signed)
Jacob Aguirre , 03-17-1965, 54 y.o., male MRN: [id number] Patient Care Team    Relationship Specialty Notifications Start End  Ma Hillock, DO PCP - General Family Medicine  07/12/17   Nahser, Wonda Cheng, MD  Cardiology  08/27/11   Deneise Lever, MD Consulting Physician Pulmonary Disease  07/12/17     Chief Complaint  Patient presents with  . Hypertension    4 wk F/U. Pt does not check BP at home. Pt took meds this AM @ 720     Subjective: Jacob Aguirre is a  54 y.o. Pt presents for an OV to follow-up on Jacob Aguirre Hypertension/hyperlipidemia: Pt reports compliance with lisinopril 40 mg daily. Patient denies chest pain, shortness of breath, dizziness or lower extremity edema.  Patient has restarted low-dose Lipitor and is tolerating. Labs up-to-date RF: Hypertension, hypertriglyceridemia, obesity.  Family history of stroke in his father.  Hepatic steatosis/elevated liver enzymes/thrombocytopenia:  LFTs were normal 1 month ago.  Diclofenac has been discontinued.  Statin was discontinued temporarily.  Added back a very low-dose of Lipitor 10 mg in hopes he can tolerate low-dose without elevated liver enzymes in order to have the cardiovascular protection staff provides.  From both cytopenia has been mild and stable.  However he has had some hematuria off and on.  He has established with hematology since her last visit.  They plan to follow-up with him in 6 months.  Uncertain etiology of thrombocytopenia but ITP is possibility. Prior note: Elevated liver enzymes reported by his health at work labs.  After receiving his health at work papers we discontinued his Voltaren and Lipitor.  Timeline suggest Voltaren may have increased his LFTs in 2019, however they had been mildly elevated even up into that point.  He does report he is taking omega-3's and increasing his fiber now for his cholesterol.  His repeat lipid panel last month looked great with no statin.  His father recently had a stroke and  patient will benefit from adding back on cholesterol medicine for CV protection when able.  He states he has started a Mediterranean diet, he is no longer eating red meat.  He has started the Mobic in place of the Voltaren and feels it is working rather well to even better. His platelets had decreased 1 month ago and were commented on being enlarged.  This is a new finding for him.  Hematuria: Trace hematuria has been chronic.  October no hematuria was appreciated-however in April trace of hematuria again noted.   However he has a history of hematuria during a physical for the past 4 years through his work. PSA is also checked by [facility] and normal. He denies personal h/o kidney stones, but reorts his father has had many.Prior smoker - quit 1992- only 3 yr smoker.   Arthritis: Patient reports the Mobic has been working great for him..    Depression screen [facility] 2/9 01/25/2019 07/12/2017  Decreased Interest 0 0  Down, Depressed, Hopeless 0 0  PHQ - 2 Score 0 0    Allergies  Allergen Reactions  . Erythromycin   . Niacin And Related     Hives  . Penicillins     Causes Hives   Social History   Social History Narrative   Married.   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    Nonsmoker.   Drinks caffeine.    Smoke alarm in the home. Wears her seatbelt.    Owns firearms.    Feels safe in  his relationships.    Past Medical History:  Diagnosis Date  . Gout    Right large toe  . Hematuria    pt reports chronic.   Marland Kitchen HTN (hypertension)    Dr. Acie Fredrickson  . Hypercholesterolemia   . Meniscal injury 1990  . OSA (obstructive sleep apnea)    wears CPAP; Dr. Annamaria Boots  . Sleep apnea    cpap  . Tinea corporis    Past Surgical History:  Procedure Laterality Date  . FINGER SURGERY    . KNEE ARTHROSCOPY W/ MENISCAL REPAIR Right 1990  . KNEE SURGERY     Broken Knee Cap  . pilonidal cyst resection    . POPLITEAL SYNOVIAL CYST EXCISION Right    3rd grade   Family History  Problem Relation Age of Onset   . Heart murmur Father   . Hearing loss Father   . Colon polyps Father   . Stroke Father   . Hypertension Mother   . COPD Mother   . Testicular cancer Brother 80       And prostate cancer  . Colon cancer Neg Hx   . Esophageal cancer Neg Hx   . Rectal cancer Neg Hx   . Stomach cancer Neg Hx    Allergies as of 09/11/2019      Reactions   Erythromycin    Niacin And Related    Hives   Penicillins    Causes Hives      Medication List       Accurate as of September 11, 2019  8:36 AM. If you have any questions, ask your nurse or doctor.        alclomethasone 0.05 % cream Commonly known as: ACLOVATE APPLY TO AFFECTED AREA EVERY DAY TO TWICE A DAY   amLODipine 10 MG tablet Commonly known as: NORVASC Take 1 tablet (10 mg total) by mouth daily. What changed:   medication strength  how much to take Changed by: Howard Pouch, DO   atorvastatin 10 MG tablet Commonly known as: LIPITOR Take 1 tablet (10 mg total) by mouth daily.   clobetasol cream 0.05 % Commonly known as: TEMOVATE Apply 1 application topically 1 day or 1 dose. Apply once to twice daily   FLAXSEED OIL PO Take by mouth.   lisinopril 40 MG tablet Commonly known as: ZESTRIL Take 1 tablet (40 mg total) by mouth daily.   meloxicam 15 MG tablet Commonly known as: MOBIC Take 1 tablet (15 mg total) by mouth daily.   OMEGA 3 PO Take 1 tablet by mouth 3 (three) times daily.       All past medical history, surgical history, allergies, family history, immunizations andmedications were updated in the EMR today and reviewed under the history and medication portions of their EMR.     ROS: Negative, with the exception of above mentioned in HPI   Objective:  BP (!) 149/90 (BP Location: Left Arm, Patient Position: Sitting, Cuff Size: Normal)   Pulse 60   Temp 98.3 F (36.8 C) (Temporal)   Resp 17   Ht 5\' 11"  (1.803 m)   Wt 244 lb (110.7 kg)   SpO2 96%   BMI 34.03 kg/m  Body mass index is 34.03 kg/m. Gen:  Afebrile. No acute distress.  HENT: AT. [place].  Eyes:Pupils Equal Round Reactive to light, Extraocular movements intact,  Conjunctiva without redness, discharge or icterus. Neck/lymp/endocrine: Supple, no lymphadenopathy, no thyromegaly CV: RRR no murmur, no edema, +2/4 P posterior tibialis pulses Chest: CTAB,  no wheeze or crackles.  Neuro:  Normal gait. PERLA. EOMi. Alert. Oriented x3  Psych: Normal affect, dress and demeanor. Normal speech. Normal thought content and judgment..   No exam data present No results found. No results found for this or any previous visit (from the past 24 hour(s)).  Assessment/Plan: ARLAN BIRKS is a 54 y.o. male present for OV for  Hematuria, unspecified type/thrombocytopenia -No hematuria by repeat 12/15/2018.  Trace present on 06/2019 labs.  Discussed referral to urology since it has been reportedly persistent for 4 years although patient states it is always only said trace.  He has a prior history of smoking temporarily which could put him at high risk.  -  Pt would like to wait on referral for now> has remained trace only.  Okay to monitor for now. - possibly related to OSA, elevated BP or plt d/o  Mixed hyperlipidemia/Essential hypertension/morbid obesity Still above goal.  He does not monitor his blood pressures at home.  I have encouraged him to start doing this a few times a week, least 2 hours after medications are in his system so that we can see what his blood pressures are at home on regimen. Continue lisinopril to 40 mg daily. Increase amlodipine 5>10 mg qd Continue Lipitor at low-dose 10 mg nightly for CV protection with family history of stroke in his father> lipids and LFT at 62-month follow-up. -Continue omega-3 3000 mg  -Continue higher fiber diet/Metamucil - Fhx of recent stroke in his father.  Follow-up 3 months  Arthritis/knee pain:  Stable Continue Mobic 15 mg daily He does have mild thrombocytopenia we will need to monitor on chronic  NSAID.   Follow-up 3 months   Elevated LFTs/hepatic steatosis>resolved off Voltaren and statin  -Hepatic steatosis on ultrasound -HIV, hepatitis panel, anti-smooth muscle antibody negative. -Left he is returned to normal off statin and well-appearing.   Will recheck LFTs next appointment in 3 months.  Thrombocytopenia/platelet enlargement: Est w/ heme  OSA with CPAP: Resistant hypertension may be partially caused by his OSA/CPAP.  Although I would have expected his OSA to improve with his weight loss.  He has not seen his pulmonologist in some time for reevaluation.   Referred back to pulmonology over 1 month ago.  He has not heard from them yet.  We will look into referral delay for him.  Patient was advised to call back in 1 week if he has not heard from his pulmonology office.   Reviewed expectations re: course of current medical issues.  Discussed self-management of symptoms.  Outlined signs and symptoms indicating need for more acute intervention.  Patient verbalized understanding and all questions were answered.  Patient received an After-Visit Summary.    No orders of the defined types were placed in this encounter.  Meds ordered this encounter  Medications  . meloxicam (MOBIC) 15 MG tablet    Sig: Take 1 tablet (15 mg total) by mouth daily.    Dispense:  90 tablet    Refill:  1  . lisinopril (ZESTRIL) 40 MG tablet    Sig: Take 1 tablet (40 mg total) by mouth daily.    Dispense:  90 tablet    Refill:  1  . amLODipine (NORVASC) 10 MG tablet    Sig: Take 1 tablet (10 mg total) by mouth daily.    Dispense:  90 tablet    Refill:  1    DC prior script.   Referral Orders  No referral(s) requested today  Note is dictated utilizing voice recognition software. Although note has been proof read prior to signing, occasional typographical errors still can be missed. If any questions arise, please do not hesitate to call for verification.   electronically signed  by:  Howard Pouch, DO  [place]

## 2019-10-15 ENCOUNTER — Other Ambulatory Visit: Payer: Self-pay | Admitting: Physician Assistant

## 2019-12-13 ENCOUNTER — Encounter: Payer: Self-pay | Admitting: Internal Medicine

## 2019-12-13 ENCOUNTER — Other Ambulatory Visit: Payer: Self-pay

## 2019-12-13 ENCOUNTER — Ambulatory Visit (INDEPENDENT_AMBULATORY_CARE_PROVIDER_SITE_OTHER): Payer: 59 | Admitting: Internal Medicine

## 2019-12-13 VITALS — BP 140/82 | HR 81 | Temp 97.2°F | Ht 71.0 in | Wt 260.8 lb

## 2019-12-13 DIAGNOSIS — Z7189 Other specified counseling: Secondary | ICD-10-CM | POA: Diagnosis not present

## 2019-12-13 DIAGNOSIS — G4733 Obstructive sleep apnea (adult) (pediatric): Secondary | ICD-10-CM | POA: Diagnosis not present

## 2019-12-13 NOTE — Progress Notes (Signed)
Subjective:    Patient ID: Jacob Aguirre, male    DOB: 02/29/1966, 54 y.o.   MRN: [id number]  HPI   M former smoker , fireman/ rotating shifts,  followed for OSA, complicated by HTN, Hepatic Steatosis, Thrombocytopenia,  Obesity, Hyperlipidemia,  NPSG 05/2011   AHI 56/ hr,     HST 2014  AHI 40/ hr  ----------------------------------------------------------------------------------------------------------   12/13/19- 54 yoM former smoker coming to re-establish. Last here in 2017, followed for OSA, complicated by HTN, Hepatic Steatosis, Thrombocytopenia,  Obesity, Hyperlipidemia,  NPSG 05/2011   AHI 56/ hr,    HST 2014  AHI 40/ hr CPAP auto 5-15/ [facility] Download- Covid vax- none Flu vax- Will get at work Body weight today- 260 lbs He has been using original machine compliantly and has slept better with it. He asks about easier ways to get supplies, since [facility] is an hour away from him. Asks about getting supplies from [facility] or on-line. Without CPAP he snores loudly and wakes up a lot. Being treated now for HTN and being evaluated for thrombocytopenia.  Has a So-Clean machine- discussed.   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  We had a long-talk about Covid infection and vaccine and I answered his questions, letting him know that I recommended vaccine.   ROS-see HPI   + = positive Constitutional:    weight loss, night sweats, fevers, chills, fatigue, lassitude. HEENT:    headaches, difficulty swallowing, tooth/dental problems, sore throat,       sneezing, itching, ear ache, nasal congestion, post nasal drip, snoring CV:    chest pain, orthopnea, PND, swelling in lower extremities, anasarca,                                   dizziness, palpitations Resp:   shortness of breath with exertion or at rest.                productive cough,   non-productive cough, coughing up of blood.              change in color of mucus.   wheezing.   Skin:    rash or lesions. GI:  No-   heartburn, indigestion, abdominal pain, nausea, vomiting, diarrhea,                 change in bowel habits, loss of appetite GU: dysuria, change in color of urine, no urgency or frequency.   flank pain. MS:   joint pain, stiffness, decreased range of motion, back pain. Neuro-     nothing unusual Psych:  change in mood or affect.  depression or anxiety.   memory loss.  .      Objective:  OBJ- Physical Exam General- Alert, Oriented, Affect-appropriate, Distress- none acute, + brawny Skin- rash-none, lesions- none, excoriation- none Lymphadenopathy- none Head- atraumatic            Eyes- Gross vision intact, PERRLA, conjunctivae and secretions clear            Ears- Hearing, canals-normal            Nose- Clear, no-Septal dev, mucus, polyps, erosion, perforation             Throat- Mallampati III , mucosa clear , drainage- none, tonsils- atrophic, + teeth Neck- flexible , trachea midline, no stridor , thyroid nl,  carotid no bruit Chest - symmetrical excursion , unlabored           Heart/CV- RRR , no murmur , no gallop  , no rub, nl s1 s2                           - JVD- none , edema- none, stasis changes- none, varices- none           Lung- clear to P&A, wheeze- none, cough- none , dullness-none, rub- none           Chest wall-  Abd-  Br/ Gen/ Rectal- Not done, not indicated Extrem- cyanosis- none, clubbing, none, atrophy- none, strength- nl Neuro- grossly intact to observation   Assessment & Plan:

## 2019-12-13 NOTE — Assessment & Plan Note (Signed)
Brawny man. Weight has fluctuated quite a bit, which will impact sleep apnea and may ipact other health problems as well.

## 2019-12-13 NOTE — Patient Instructions (Signed)
Order- DME [facility] - please replace old CPAP machine auto 5-15, mask of choice, humidifier, supplies, AirView/ card  Print copy of same prescription- you can use at [facility] or with [email or web address] or similar on-line supplier.  Please call if there are problems or questions we can help with.

## 2019-12-13 NOTE — Assessment & Plan Note (Signed)
Appropriate discussion and questions answered. Plan- replace old CPAP, continuing auto 5-15, until we get a download. Will order through [facility], but he may decide to get supplies son-line or from [place].

## 2019-12-13 NOTE — Assessment & Plan Note (Signed)
Discussed infection, purpose and expectations of covid vaccine. He may decide to get it later.

## 2019-12-14 ENCOUNTER — Telehealth: Payer: Self-pay | Admitting: Internal Medicine

## 2019-12-14 NOTE — Telephone Encounter (Signed)
Left message for patient to call back. Will need to know if he is ok with [place] ordering a new cpap machine.

## 2019-12-14 NOTE — Telephone Encounter (Signed)
Called [facility] and spoke with Anderson Malta who stated that they are not able to get a download off of pt's old CPAP machine as his machine is a Resmed S9 which is the older CPAP version. This machine they are not able to get download off using Airview as pt would need to bring SD card to either our office or DME office for the download to be able to be done.  Anderson Malta said if we sent the order over for pt to receive a replacement CPAP machine, they will be able to get this taken care of for pt.  Dr. Annamaria Boots, please advise if you still want pt to take old CPAP machine to DME for a download to be gotten off of it or if you are okay with them just replacing his machine with the auto settings stated?

## 2019-12-14 NOTE — Telephone Encounter (Signed)
Ok to please replace old CPAP machine with current settings, mask of choice, humidifier, supplies, AirView/ card

## 2019-12-18 NOTE — Telephone Encounter (Signed)
Lmtcb for pt.  

## 2019-12-20 NOTE — Telephone Encounter (Signed)
Called and left a detailed message for pt to see if he is ok with a new CPAP machine and to call back to let us know if ok to order new machine or not. Will await call.

## 2019-12-21 ENCOUNTER — Telehealth: Payer: Self-pay | Admitting: Internal Medicine

## 2019-12-21 DIAGNOSIS — G4733 Obstructive sleep apnea (adult) (pediatric): Secondary | ICD-10-CM

## 2019-12-21 NOTE — Telephone Encounter (Signed)
Called and spoke to patient. Patient is questioning if [facility] provides cpap machine.  Per Vallarie Mare, [facility] does not file insurance therefore patient would have to pay out of pocket. Patient would like order placed to [facility], as he prefers a company that [employer].  Order has been placed to [facility] per patient request. Nothing further is needed.

## 2020-01-17 ENCOUNTER — Telehealth: Payer: Self-pay

## 2020-01-17 NOTE — Telephone Encounter (Signed)
Pt dme company [facility] was called stated pt had to bring card in for a down load and pt was cALLED AND LEFT VM TO BRING CARD IN TO DME  Company so download can be obtained

## 2020-01-18 ENCOUNTER — Telehealth: Payer: Self-pay | Admitting: Internal Medicine

## 2020-01-18 DIAGNOSIS — G4733 Obstructive sleep apnea (adult) (pediatric): Secondary | ICD-10-CM

## 2020-01-18 NOTE — Telephone Encounter (Signed)
ATC patient left message letting him know that we would keep an eye out for paperwork from [facility] card. Will route to Dr. Annamaria Boots as Juluis Rainier to keep an eye out as well

## 2020-01-23 NOTE — Telephone Encounter (Signed)
Dr. Annamaria Boots, please advise if you have received SD card.

## 2020-02-01 NOTE — Telephone Encounter (Signed)
I checked CY's folders but did not see either a SD card or a download from this patient. Dr. Annamaria Boots please advise if you have this in your possession, or if we need to re-request this information.  Thanks!

## 2020-02-02 NOTE — Telephone Encounter (Signed)
He sent the SD card somewhere, but I have not gotten a download.

## 2020-02-04 NOTE — Telephone Encounter (Signed)
Fax received and placed in Dr Janee Morn area in C pod for his review.  Spoke with [facility] to let them know we did receive fax this time.

## 2020-02-04 NOTE — Telephone Encounter (Signed)
LMTCB for the pt 

## 2020-02-04 NOTE — Telephone Encounter (Signed)
Called [facility] and spoke with Levander Campion  She states the DL was faxed on 01/21/20 and she has the fax confirmation  I advised that we do not have this and asked her to please refax it  She is going to send this to the up front fax and asked that we call her to let her know once received  Will await fax and then call her at [phone]

## 2020-02-04 NOTE — Telephone Encounter (Signed)
We finally got the down-load report on his CPAP from [place]. This shows the current CPAP setting of autopap 5-15 still gives good control when used, so we can continue that setting.  Does Jacob Aguirre or [facility] now need anything more from [place]?

## 2020-02-05 NOTE — Telephone Encounter (Signed)
Patient returning call.  [phone].  Can send message through myChart.  Please advise.

## 2020-02-05 NOTE — Telephone Encounter (Signed)
Sent MyChart message as patient requested

## 2020-02-05 NOTE — Telephone Encounter (Signed)
Patient is returning phone call. Patient phone number is [phone].

## 2020-02-05 NOTE — Telephone Encounter (Signed)
Attempted to call pt but unable to reach. Left message for him to return call. °

## 2020-02-06 NOTE — Telephone Encounter (Signed)
Dr. Annamaria Boots, please see mychart message sent by pt and advise:  To: [facility]OL    From: MASAO JUNKER    Created: 02/06/2020 9:00 AM     *-*-*This message has not been handled.*-*-*  Please note that [facility] does not accept my insurance.  [facility] referred me to [facility].  There contact is Ivin Booty at [phone].  They are the ones who requested the down load.  They asked why I needed a cpap machine to which I said read the Dr's orders.  [facility] just as a curtesy sent the download.  Please contact [facility] so I can get the new machine.  Currently I'm using a mix of different hoses and mask to keep what I have working. This has been going on since at least Oct 11 with [facility].  Please help

## 2020-02-11 NOTE — Telephone Encounter (Signed)
Attempted to call pt but unable to reach. Left pt a detailed message that I was sending response to mychart that he could respond to that message. Order has been placed for pt's cpap per recommendations by CY. Will wait for response from pt prior to closing encounter.

## 2020-02-11 NOTE — Telephone Encounter (Signed)
His insurance no longer accepts [facility]. Suggest we redirect CPAP order to [facility].  Original sleep study from 06/01/11 is in media, with interpretation in my recent ov note.He has benefited and been compliant with CPAP. Please order replacement for old CPAP machine   auto 5-15, mask of choice, humidifier, supplies, Airview/ card

## 2020-02-14 ENCOUNTER — Encounter: Payer: Self-pay | Admitting: Dermatology

## 2020-02-14 ENCOUNTER — Other Ambulatory Visit: Payer: Self-pay

## 2020-02-14 ENCOUNTER — Ambulatory Visit (INDEPENDENT_AMBULATORY_CARE_PROVIDER_SITE_OTHER): Payer: 59 | Admitting: Dermatology

## 2020-02-14 DIAGNOSIS — L821 Other seborrheic keratosis: Secondary | ICD-10-CM

## 2020-02-14 DIAGNOSIS — B078 Other viral warts: Secondary | ICD-10-CM | POA: Diagnosis not present

## 2020-02-14 DIAGNOSIS — L570 Actinic keratosis: Secondary | ICD-10-CM | POA: Diagnosis not present

## 2020-02-14 NOTE — Patient Instructions (Addendum)
Routine follow-up care for Jacob Aguirre date of birth [birth year].  He has a troubling very thick wart on the right index finger which was treated with 10-second liquid nitrogen freeze.  He can expect that this will swell and may be a little tender.  A Band-Aid is optional.  If it is not completely clear in a month he should pick up Verruca-Freeze or Compound W freezer or any similar wart freeze at [employer] or supermarket or drugstore and use it every 1 to 3 weeks to hopefully get the wart clear.  On the top of the left hand in a couple of spots on the forearm are half dozen crusts which represent minor sun damage called solar keratoses.  These were treated with 5-second freeze and require no special care.  I encouraged him to have a general skin check done once a year.

## 2020-02-15 ENCOUNTER — Inpatient Hospital Stay: Payer: 59 | Attending: Hematology and Oncology

## 2020-02-15 ENCOUNTER — Other Ambulatory Visit: Payer: Self-pay

## 2020-02-15 DIAGNOSIS — D696 Thrombocytopenia, unspecified: Secondary | ICD-10-CM | POA: Diagnosis present

## 2020-02-15 LAB — PLATELET BY CITRATE

## 2020-02-15 LAB — CBC WITH DIFFERENTIAL (CANCER CENTER ONLY)
Abs Immature Granulocytes: 0.03 10*3/uL (ref 0.00–0.07)
Basophils Absolute: 0 10*3/uL (ref 0.0–0.1)
Basophils Relative: 1 %
Eosinophils Absolute: 0.1 10*3/uL (ref 0.0–0.5)
Eosinophils Relative: 1 %
HCT: 48.6 % (ref 39.0–52.0)
Hemoglobin: 16.7 g/dL (ref 13.0–17.0)
Immature Granulocytes: 0 %
Lymphocytes Relative: 28 %
Lymphs Abs: 2.2 10*3/uL (ref 0.7–4.0)
MCH: 30.3 pg (ref 26.0–34.0)
MCHC: 34.4 g/dL (ref 30.0–36.0)
MCV: 88.2 fL (ref 80.0–100.0)
Monocytes Absolute: 0.7 10*3/uL (ref 0.1–1.0)
Monocytes Relative: 9 %
Neutro Abs: 4.7 10*3/uL (ref 1.7–7.7)
Neutrophils Relative %: 61 %
Platelet Count: 152 10*3/uL (ref 150–400)
RBC: 5.51 MIL/uL (ref 4.22–5.81)
RDW: 12.2 % (ref 11.5–15.5)
WBC Count: 7.7 10*3/uL (ref 4.0–10.5)
nRBC: 0 % (ref 0.0–0.2)

## 2020-02-18 ENCOUNTER — Encounter: Payer: Self-pay | Admitting: Dermatology

## 2020-02-18 NOTE — Progress Notes (Signed)
   Follow-Up Visit   Subjective  Jacob Aguirre is a 54 y.o. male who presents for the following: Warts (? wart on right index finger- tx- otc compound w ).  Thick wart Location: Right index finger Duration:  Quality:  Associated Signs/Symptoms: Sensitive to pressure Modifying Factors:  Severity:  Timing: Context: Also has noticed crust on back of left hand and forearms  Objective  Well appearing patient in no apparent distress; mood and affect are within normal limits.  A focused examination was performed including Head, neck, arms, hands.. Relevant physical exam findings are noted in the Assessment and Plan.   Assessment & Plan    Common wart Right 2nd Finger Proximal Interphalangeal Joint  Destruction of lesion - Right 2nd Finger Proximal Interphalangeal Joint Complexity: simple   Destruction method: cryotherapy   Informed consent: discussed and consent obtained   Timeout:  patient name, date of birth, surgical site, and procedure verified Lesion destroyed using liquid nitrogen: Yes   Cryotherapy cycles:  3 Outcome: patient tolerated procedure well with no complications    Actinic keratosis Left Dorsal Hand  Destruction of lesion - Left Dorsal Hand Complexity: simple   Destruction method: cryotherapy   Informed consent: discussed and consent obtained   Timeout:  patient name, date of birth, surgical site, and procedure verified Lesion destroyed using liquid nitrogen: Yes   Cryotherapy cycles:  3 Outcome: patient tolerated procedure well with no complications    Routine follow-up care for Jacob Aguirre date of birth 11-13-1965.  He has a troubling very thick wart on the right index finger which was treated with 10-second liquid nitrogen freeze.  He can expect that this will swell and may be a little tender.  A Band-Aid is optional.  If it is not completely clear in a month he should pick up Verruca-Freeze or Compound W freezer or any similar wart freeze at  [employer] or supermarket or drugstore and use it every 1 to 3 weeks to hopefully get the wart clear.  On the top of the left hand in a couple of spots on the forearm are half dozen crusts which represent minor sun damage called solar keratoses.  These were treated with 5-second freeze and require no special care.  I encouraged him to have a general skin check done once a year.   I, Lavonna Monarch, MD, have reviewed all documentation for this visit.  The documentation on 02/18/20 for the exam, diagnosis, procedures, and orders are all accurate and complete.

## 2020-03-06 ENCOUNTER — Other Ambulatory Visit: Payer: Self-pay | Admitting: Family Medicine

## 2020-03-06 DIAGNOSIS — I10 Essential (primary) hypertension: Secondary | ICD-10-CM

## 2020-03-19 ENCOUNTER — Telehealth: Payer: Self-pay | Admitting: Internal Medicine

## 2020-03-19 DIAGNOSIS — G4733 Obstructive sleep apnea (adult) (pediatric): Secondary | ICD-10-CM

## 2020-03-19 NOTE — Telephone Encounter (Signed)
Copied from referral to [facility]:   Note   Jacob Aguirre, Jacob Aguirre, Jacob Aguirre; Jacob Aguirre, Jacob Aguirre; Jacob Aguirre; Jacob Aguirre, Jacob Aguirre, sent a msg in portal on 01/07/20 asking why unit needed to be replaced. Spoke w/ patient and he stated nothing is wrong with current unit. With his insurance a replacement unit will not be covered if there is nothing wrong w/ current unit.   Thank you      Spoke with pt, states was never told that his insurance wouldn't cover a replacement cpap if his is currently working.  I apologized for the miscommunication, that our office had been told that they had spoken directly with him. Pt is needing new mask/headgear/tubing for his cpap.  Pt also states that about once a week his lid of his humidifier "pops off" while he's sleeping.   Pt requesting new supplies and for this to be serviced.  CY ok to place order for both? Thanks.

## 2020-03-19 NOTE — Telephone Encounter (Signed)
Yes- ok to order supplies and service for machine

## 2020-03-20 NOTE — Telephone Encounter (Signed)
Order has been placed per CY ok.  Nothing further is needed.

## 2020-03-20 NOTE — Telephone Encounter (Signed)
lmtcb for pt.  

## 2020-04-25 ENCOUNTER — Other Ambulatory Visit: Payer: Self-pay | Admitting: Family Medicine

## 2020-04-25 DIAGNOSIS — I10 Essential (primary) hypertension: Secondary | ICD-10-CM

## 2020-04-29 ENCOUNTER — Encounter: Payer: Self-pay | Admitting: Family Medicine

## 2020-04-29 ENCOUNTER — Ambulatory Visit: Payer: 59 | Admitting: Family Medicine

## 2020-04-29 ENCOUNTER — Other Ambulatory Visit: Payer: Self-pay | Admitting: Physician Assistant

## 2020-04-29 VITALS — BP 144/89 | HR 62 | Temp 98.2°F | Ht 71.0 in | Wt 262.0 lb

## 2020-04-29 DIAGNOSIS — G4733 Obstructive sleep apnea (adult) (pediatric): Secondary | ICD-10-CM

## 2020-04-29 DIAGNOSIS — R7989 Other specified abnormal findings of blood chemistry: Secondary | ICD-10-CM

## 2020-04-29 DIAGNOSIS — E782 Mixed hyperlipidemia: Secondary | ICD-10-CM

## 2020-04-29 DIAGNOSIS — Z23 Encounter for immunization: Secondary | ICD-10-CM | POA: Diagnosis not present

## 2020-04-29 DIAGNOSIS — R319 Hematuria, unspecified: Secondary | ICD-10-CM

## 2020-04-29 DIAGNOSIS — I10 Essential (primary) hypertension: Secondary | ICD-10-CM | POA: Diagnosis not present

## 2020-04-29 DIAGNOSIS — K760 Fatty (change of) liver, not elsewhere classified: Secondary | ICD-10-CM

## 2020-04-29 DIAGNOSIS — M1710 Unilateral primary osteoarthritis, unspecified knee: Secondary | ICD-10-CM

## 2020-04-29 DIAGNOSIS — D696 Thrombocytopenia, unspecified: Secondary | ICD-10-CM

## 2020-04-29 DIAGNOSIS — Z79899 Other long term (current) drug therapy: Secondary | ICD-10-CM

## 2020-04-29 MED ORDER — MELOXICAM 15 MG PO TABS: 15.0000 mg | ORAL_TABLET | Freq: Every day | ORAL | 1 refills | Status: DC

## 2020-04-29 MED ORDER — AMLODIPINE BESYLATE 10 MG PO TABS: 10.0000 mg | ORAL_TABLET | Freq: Every day | ORAL | 1 refills | Status: DC

## 2020-04-29 MED ORDER — LISINOPRIL-HYDROCHLOROTHIAZIDE 20-12.5 MG PO TABS: 2.0000 | ORAL_TABLET | Freq: Every day | ORAL | 1 refills | Status: DC

## 2020-04-29 MED ORDER — ATORVASTATIN CALCIUM 10 MG PO TABS: 10.0000 mg | ORAL_TABLET | Freq: Every day | ORAL | 3 refills | Status: DC
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
## 2020-04-29 NOTE — Patient Instructions (Addendum)
Nice to see you today.  Lab appt only - fasting within 1 week.   Next in person visit with provider 5.5 mos.

## 2020-05-02 ENCOUNTER — Telehealth: Payer: Self-pay

## 2020-05-02 NOTE — Telephone Encounter (Signed)
Please advise 

## 2020-05-02 NOTE — Telephone Encounter (Signed)
Seen Dr. Raoul Pitch earlier in the week, still has ear pain with no improvement. Please advise [phone].

## 2020-05-02 NOTE — Telephone Encounter (Signed)
Spoke with pt and informed him of providers instructions. Pt verbalized understanding

## 2020-05-02 NOTE — Telephone Encounter (Signed)
He is instructed to use an antihistamine of choice (Xyzal, Zyrtec or Allegra) and Flonase over-the-counter nasal spray 2 sprays both sides twice a day.  If his ear is still bothering him next week I would encourage him to make a follow-up appointment with provider.

## 2020-05-09 ENCOUNTER — Ambulatory Visit: Payer: 59

## 2020-05-09 ENCOUNTER — Telehealth: Payer: Self-pay

## 2020-05-09 NOTE — Telephone Encounter (Signed)
Appt rescheduled for 2/28  [facility] [place] Day - Client Nonclinical Telephone Record  [facility] Client [place] Day - Client Client Site [place] - Day Physician Raoul Pitch, [place] Type Call Who Is Calling Patient / Member / Family / Caregiver Caller Name Emmett Phone Number [phone] Patient Name Jacob Aguirre Patient DOB 1965-11-15 Call Type Message Only Information Provided Reason for Call Request to Reschedule Office Appointment Initial Comment Caller states he has a blood draw he needs to reschedule. He had to go out of town. Disp. Time Disposition Final User 05/09/2020 7:48:20 AM General Information Provided Yes Nolon Bussing Call Closed By: Nolon Bussing Transaction Date/Time: 05/09/2020 7:45:31 AM (ET)

## 2020-05-12 ENCOUNTER — Other Ambulatory Visit: Payer: Self-pay

## 2020-05-12 ENCOUNTER — Ambulatory Visit (INDEPENDENT_AMBULATORY_CARE_PROVIDER_SITE_OTHER): Payer: 59

## 2020-05-12 DIAGNOSIS — I10 Essential (primary) hypertension: Secondary | ICD-10-CM | POA: Diagnosis not present

## 2020-05-12 DIAGNOSIS — Z79899 Other long term (current) drug therapy: Secondary | ICD-10-CM

## 2020-05-12 DIAGNOSIS — E782 Mixed hyperlipidemia: Secondary | ICD-10-CM | POA: Diagnosis not present

## 2020-05-12 DIAGNOSIS — R319 Hematuria, unspecified: Secondary | ICD-10-CM

## 2020-05-12 LAB — LDL CHOLESTEROL, DIRECT: Direct LDL: 79 mg/dL

## 2020-05-12 LAB — LIPID PANEL
Cholesterol: 163 mg/dL (ref 0–200)
HDL: 32.2 mg/dL — ABNORMAL LOW (ref >39.00)
NonHDL: 131.2
Total CHOL/HDL Ratio: 5
Triglycerides: 263 mg/dL — ABNORMAL HIGH (ref 0.0–149.0)
VLDL: 52.6 mg/dL — ABNORMAL HIGH (ref 0.0–40.0)

## 2020-05-12 LAB — COMPREHENSIVE METABOLIC PANEL
ALT: 39 U/L (ref 0–53)
AST: 26 U/L (ref 0–37)
Albumin: 4.6 g/dL (ref 3.5–5.2)
Alkaline Phosphatase: 42 U/L (ref 39–117)
BUN: 23 mg/dL (ref 6–23)
CO2: 28 mEq/L (ref 19–32)
Calcium: 10 mg/dL (ref 8.4–10.5)
Chloride: 97 mEq/L (ref 96–112)
Creatinine, Ser: 1.04 mg/dL (ref 0.40–1.50)
GFR: 82.43 mL/min (ref >60.00)
Glucose, Bld: 91 mg/dL (ref 70–99)
Potassium: 4.6 mEq/L (ref 3.5–5.1)
Sodium: 135 mEq/L (ref 135–145)
Total Bilirubin: 0.9 mg/dL (ref 0.2–1.2)
Total Protein: 7.2 g/dL (ref 6.0–8.3)

## 2020-05-12 LAB — TSH: TSH: 5.04 u[IU]/mL — ABNORMAL HIGH (ref 0.35–4.50)

## 2020-05-12 NOTE — Addendum Note (Signed)
Addended by: Octaviano Glow on: 05/12/2020 05:00 PM   Modules accepted: Orders

## 2020-05-12 NOTE — Addendum Note (Signed)
Addended by: Octaviano Glow on: 05/12/2020 05:09 PM   Modules accepted: Orders

## 2020-05-13 ENCOUNTER — Telehealth: Payer: Self-pay | Admitting: Family Medicine

## 2020-05-13 DIAGNOSIS — R946 Abnormal results of thyroid function studies: Secondary | ICD-10-CM

## 2020-05-13 DIAGNOSIS — E039 Hypothyroidism, unspecified: Secondary | ICD-10-CM | POA: Insufficient documentation

## 2020-05-13 LAB — URINALYSIS, ROUTINE W REFLEX MICROSCOPIC
Bilirubin Urine: NEGATIVE
Glucose, UA: NEGATIVE
Hgb urine dipstick: NEGATIVE
Ketones, ur: NEGATIVE
Leukocytes,Ua: NEGATIVE
Nitrite: NEGATIVE
Protein, ur: NEGATIVE
Specific Gravity, Urine: 1.02 (ref 1.001–1.03)
pH: <=5 (ref 5.0–8.0)

## 2020-05-13 MED ORDER — ATORVASTATIN CALCIUM 20 MG PO TABS: 20.0000 mg | ORAL_TABLET | Freq: Every day | ORAL | 3 refills | Status: DC

## 2020-05-13 NOTE — Telephone Encounter (Signed)
LVM for pt to CB regarding results.  

## 2020-05-13 NOTE — Telephone Encounter (Signed)
Please inform patient the following information: -Urinalysis is normal.  No need to further evaluate for blood in the urine. -Liver and kidney function are normal.  Liver enzymes have remained normal despite restart of statin which is good. -Cholesterol panel overall looks great except for the triglycerides are still mildly above goal at 263, goal being less than 150 for triglycerides.   -I have called in an increased dose of Lipitor/atorvastatin to 20 mg -take after dinner or before bed.  He can finish the lower dose bottle he currently has by taking Two tabs of the Lipitor 10 mg until current bottle is finished.  New bottle will be 20 mg per tab, with new bottle take 1 tab.   -I would also recommend he start 2 servings of Metamucil daily.  This is a over-the-counter fiber supplement that also helps decrease triglycerides in the body.  -Lastly, his thyroid function is slightly underproductive.  Thyroid functions can wax and wane just outside of normal range naturally and would not be concerning as long as it returns to normal range.  If it does not return to normal range or if patient has symptoms such as constipation, depression, unintentional weight gain and fatigue-then a medication would be needed to replace the thyroid hormone.   -Since it is mildly outside of normal range, we do not need to start medicines now, but we do need to get him back for lab appointment only in 8 weeks to retest the thyroid function.  If it is still results with an underproductive thyroid then we would need to start medication at that time.  Orders have been placed.  Please schedule him for lab appointment only in 8 weeks.  Thanks

## 2020-05-14 NOTE — Telephone Encounter (Signed)
Spoke with pt regarding labs and instructions.   

## 2020-06-09 ENCOUNTER — Encounter: Payer: Self-pay | Admitting: Cardiovascular Disease

## 2020-06-09 NOTE — Progress Notes (Signed)
Jacob Aguirre Study Date of Birth  07-29-1965        [address]    [address]     [place], [place]  [address]         Problems: 1. Hypercholesterolemia 2. Hypertension 3. Obstructive sleep apnea     Jacob Aguirre has done fairly well since I last saw him. He is trying to eat a little bit better. He's not had any episodes of chest pain or shortness breath.  02/12/2013  Jacob Aguirre is doing well.  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  No Cp , no dyspnea.  Dec. 3, 2015:  Jacob Aguirre is seen for his hyperlipidemia and HTN.  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  Dec. 1, 2016: Doing well. No CP or dyspnea.  Wt Readings from Last 3 Encounters:  02/13/15 241 lb 12.8 oz (109.68 kg)  02/14/14 230 lb (104.327 kg)  02/13/14 234 lb 12.8 oz (106.505 kg)   Has gained a bit of weight/  May 27, 2016:  Doing well.  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    January 03, 2018:  Jacob Aguirre  is seen today for follow-up of his hyperlipidemia and hypertension. No CP or dyspnea Having some knee problems .   Needs arthoscopic surgery at some point  BP is elevated - just got back from the beach and ate lots of salty foods there.   June 10, 2020: Jacob Aguirre is seen today after 2 1/2 year absence Hx of HTN, and HLD  Still has not had the arthroscopic surgery on his knee. Wt is 253 lbs today   Works out regularly . Goes 10 miles on bike and treadmill  Chol  from Feb, 2022 look good  Triglyceride levels are elevated.   263.   Current Outpatient Medications on File Prior to Visit  Medication Sig Dispense Refill  . alclomethasone (ACLOVATE) 0.05 % cream APPLY TO AFFECTED AREA EVERY DAY TO TWICE A DAY 60 g 6  . amLODipine (NORVASC) 10 MG tablet Take 1 tablet (10 mg total) by mouth daily. 90 tablet 1  . atorvastatin (LIPITOR) 20 MG tablet Take 1 tablet (20 mg total) by mouth daily. 90 tablet 3  . clobetasol cream  (TEMOVATE) 0.05 % APPLY TO AFFECTED AREA 1-2 TIMES DAILY 30 g 1  . lisinopril-hydrochlorothiazide (ZESTORETIC) 20-12.5 MG tablet Take 2 tablets by mouth daily. 180 tablet 1  . meloxicam (MOBIC) 15 MG tablet Take 1 tablet (15 mg total) by mouth daily. 90 tablet 1   No current facility-administered medications on file prior to visit.    Allergies  Allergen Reactions  . Erythromycin   . Niacin And Related     Hives  . Penicillins     Causes Hives    Past Medical History:  Diagnosis Date  . Gout    Right large toe  . Hematuria    pt reports chronic.   Marland Kitchen HTN (hypertension)    Dr. Acie Fredrickson  . Hypercholesterolemia   . Meniscal injury 1990  . OSA (obstructive sleep apnea)    wears CPAP; Dr. Annamaria Boots  . Sleep apnea    cpap  . Tinea corporis     Past Surgical History:  Procedure Laterality Date  . FINGER SURGERY    . KNEE ARTHROSCOPY W/ MENISCAL REPAIR Right 1990  . KNEE  SURGERY     Broken Knee Cap  . pilonidal cyst resection    . POPLITEAL SYNOVIAL CYST EXCISION Right    3rd grade    Social History   Tobacco Use  Smoking Status Former Smoker  . Packs/day: 0.50  . Years: 3.00  . Pack years: 1.50  . Types: Cigarettes  . Quit date: 03/15/1990  . Years since quitting: 30.2  Smokeless Tobacco Never Used    Social History   Substance and Sexual Activity  Alcohol Use Yes  . Alcohol/week: 0.0 standard drinks    Family History  Problem Relation Age of Onset  . Heart murmur Father   . Hearing loss Father   . Colon polyps Father   . Stroke Father   . Hypertension Mother   . COPD Mother   . Testicular cancer Brother 6       And prostate cancer  . Colon cancer Neg Hx   . Esophageal cancer Neg Hx   . Rectal cancer Neg Hx   . Stomach cancer Neg Hx     Reviw of Systems:  Reviewed in the HPI.  All other systems are negative.  Physical Exam: Blood pressure 116/80, pulse (!) 58, height 5\' 11"  (1.803 m), weight 253 lb (114.8 kg), SpO2 96 %.  GEN:  Well nourished,  well developed in no acute distress HEENT: Normal NECK: No JVD; No carotid bruits LYMPHATICS: No lymphadenopathy CARDIAC: RRR , no murmurs, rubs, gallops RESPIRATORY:  Clear to auscultation without rales, wheezing or rhonchi  ABDOMEN: Soft, non-tender, non-distended MUSCULOSKELETAL:  No edema; No deformity  SKIN: Warm and dry NEUROLOGIC:  Alert and oriented x 3   ECG: June 10, 2020:  Sinus brady at 22.  No ST or T wave changes.   Assessment / Plan:   1. Hypercholesterolemia-  Chol and LDL look good.  Trigs are still elevated.  Managed by Dr. Raoul Pitch   2. Hypertension -   BP is well  Cotnrolled.   3. Obstructive sleep apnea - stable,  Managed by primary .      Mertie Moores, MD  06/10/2020 11:46 AM    [facility] [place],  [address], [place]  [address] Pager [phone] Phone: [phone]; Fax: [phone]

## 2020-06-10 ENCOUNTER — Other Ambulatory Visit: Payer: Self-pay

## 2020-06-10 ENCOUNTER — Encounter: Payer: Self-pay | Admitting: Cardiovascular Disease

## 2020-06-10 ENCOUNTER — Ambulatory Visit: Payer: 59 | Admitting: Cardiovascular Disease

## 2020-06-10 VITALS — BP 116/80 | HR 58 | Ht 71.0 in | Wt 253.0 lb

## 2020-06-10 DIAGNOSIS — I10 Essential (primary) hypertension: Secondary | ICD-10-CM | POA: Diagnosis not present

## 2020-06-10 NOTE — Patient Instructions (Signed)
Medication Instructions:  Your physician recommends that you continue on your current medications as directed. Please refer to the Current Medication list given to you today.  *If you need a refill on your cardiac medications before your next appointment, please call your pharmacy*   Lab Work: none If you have labs (blood work) drawn today and your tests are completely normal, you will receive your results only by: Marland Kitchen MyChart Message (if you have MyChart) OR . A paper copy in the mail If you have any lab test that is abnormal or we need to change your treatment, we will call you to review the results.   Testing/Procedures: none   Follow-Up: At [facility], you and your health needs are our priority.  As part of our continuing mission to provide you with exceptional heart care, we have created designated Provider Care Teams.  These Care Teams include your primary Cardiologist (physician) and Advanced Practice Providers (APPs -  Physician Assistants and Nurse Practitioners) who all work together to provide you with the care you need, when you need it.   Your next appointment:   1 year(s)  The format for your next appointment:   In Person  Provider:   Mertie Moores, MD

## 2020-07-02 ENCOUNTER — Other Ambulatory Visit: Payer: Self-pay | Admitting: Physician Assistant

## 2020-09-13 ENCOUNTER — Other Ambulatory Visit: Payer: Self-pay | Admitting: Family Medicine

## 2020-09-23 ENCOUNTER — Ambulatory Visit: Payer: 59 | Admitting: Family Medicine

## 2020-09-23 ENCOUNTER — Other Ambulatory Visit: Payer: Self-pay

## 2020-09-23 VITALS — BP 118/73 | HR 70 | Temp 97.9°F | Resp 14 | Ht 71.0 in | Wt 254.2 lb

## 2020-09-23 DIAGNOSIS — H6983 Other specified disorders of Eustachian tube, bilateral: Secondary | ICD-10-CM | POA: Diagnosis not present

## 2020-09-23 MED ORDER — FLUTICASONE PROPIONATE 50 MCG/ACT NA SUSP: 2.0000 | Freq: Every day | NASAL | 6 refills | Status: DC

## 2020-09-23 NOTE — Progress Notes (Signed)
This visit occurred during the SARS-CoV-2 public health emergency.  Safety protocols were in place, including screening questions prior to the visit, additional usage of staff PPE, and extensive cleaning of exam room while observing appropriate contact time as indicated for disinfecting solutions.    Jacob Aguirre , 05/03/1965, 55 y.o., male MRN: [id number] Patient Care Team    Relationship Specialty Notifications Start End  Ma Hillock, DO PCP - General Family Medicine  07/12/17   Nahser, Wonda Cheng, MD  Cardiology  08/27/11   Deneise Lever, MD Consulting Physician Pulmonary Disease  07/12/17   Lavonna Monarch, MD Consulting Physician Dermatology  02/14/20     Chief Complaint  Patient presents with   Fluid in ears    Only in right ear, 3 weeks. Went to [facility] 7/[facility], recommended to use Claritin D. Would like referral for ENT, if needed     Subjective: Pt presents for an OV with complaints of ear discomfort of approximately 3 weeks.  He states his right ear feels like there is pressure.  He states it came on suddenly.  He was seen at the urgent care and told he did not have an infection but to start Claritin-D.  He states he has been on Claritin-D for approximately 7 days and it has helped some, but sensation is still present.  He denies fevers or chills.  He denies recent upper respiratory infection.  He states he has noticed that his allergies are worsening this year.  Depression screen [facility] 2/9 04/29/2020 01/25/2019 07/12/2017  Decreased Interest 0 0 0  Down, Depressed, Hopeless 0 0 0  PHQ - 2 Score 0 0 0    Allergies  Allergen Reactions   Erythromycin    Niacin And Related     Hives   Penicillins     Causes Hives   Social History   Social History Narrative   Married.   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    Nonsmoker.   Drinks caffeine.    Smoke alarm in the home. Wears her seatbelt.    Owns firearms.    Feels safe in his relationships.    Past Medical  History:  Diagnosis Date   Gout    Right large toe   Hematuria    pt reports chronic.    HTN (hypertension)    Dr. Acie Fredrickson   Hypercholesterolemia    Meniscal injury 1990   OSA (obstructive sleep apnea)    wears CPAP; Dr. Annamaria Boots   Sleep apnea    cpap   Tinea corporis    Past Surgical History:  Procedure Laterality Date   FINGER SURGERY     KNEE ARTHROSCOPY W/ MENISCAL REPAIR Right 1990   KNEE SURGERY     Broken Knee Cap   pilonidal cyst resection     POPLITEAL SYNOVIAL CYST EXCISION Right    3rd grade   Family History  Problem Relation Age of Onset   Heart murmur Father    Hearing loss Father    Colon polyps Father    Stroke Father    Hypertension Mother    COPD Mother    Testicular cancer Brother 34       And prostate cancer   Colon cancer Neg Hx    Esophageal cancer Neg Hx    Rectal cancer Neg Hx    Stomach cancer Neg Hx    Allergies as of 09/23/2020       Reactions   Erythromycin  Niacin And Related    Hives   Penicillins    Causes Hives        Medication List        Accurate as of September 23, 2020  4:28 PM. If you have any questions, ask your nurse or doctor.          alclomethasone 0.05 % cream Commonly known as: ACLOVATE APPLY TO AFFECTED AREA EVERY DAY TO TWICE A DAY   amLODipine 10 MG tablet Commonly known as: NORVASC TAKE 1 TABLET BY MOUTH EVERY DAY   atorvastatin 20 MG tablet Commonly known as: LIPITOR Take 1 tablet (20 mg total) by mouth daily.   clobetasol cream 0.05 % Commonly known as: TEMOVATE APPLY TO AFFECTED AREA 1-2 TIMES DAILY   fluticasone 50 MCG/ACT nasal spray Commonly known as: FLONASE Place 2 sprays into both nostrils daily. Started by: Howard Pouch, DO   lisinopril-hydrochlorothiazide 20-12.5 MG tablet Commonly known as: ZESTORETIC TAKE 2 TABLETS BY MOUTH DAILY   meloxicam 15 MG tablet Commonly known as: MOBIC TAKE 1 TABLET (15 MG TOTAL) BY MOUTH DAILY.        All past medical history, surgical  history, allergies, family history, immunizations andmedications were updated in the EMR today and reviewed under the history and medication portions of their EMR.     ROS: Negative, with the exception of above mentioned in HPI   Objective:  BP 118/73   Pulse 70   Temp 97.9 F (36.6 C) (Oral)   Resp 14   Ht 5\' 11"  (1.803 m)   Wt 254 lb 3.2 oz (115.3 kg)   SpO2 97%   BMI 35.45 kg/m  Body mass index is 35.45 kg/m. Gen: Afebrile. No acute distress. Nontoxic in appearance, well developed, well nourished.  Pleasant male HENT: AT. [place]. Bilateral TM visualized with bilateral clear effusions and air-fluid levels. MMM.  No cough.  No hoarseness. Eyes:Pupils Equal Round Reactive to light, Extraocular movements intact,  Conjunctiva without redness, discharge or icterus. Neck/lymp/endocrine: Supple, no lymphadenopathy Neuro: Normal gait. PERLA. EOMi. Alert. Oriented x3  Psych: Normal affect, dress and demeanor. Normal speech. Normal thought content and judgment.  No results found. No results found. No results found for this or any previous visit (from the past 24 hour(s)).  Assessment/Plan: Jacob Aguirre is a 55 y.o. male present for OV for  Eustachian tube dysfunction, bilateral Discussed eustachian tube dysfunction.   Encouraged to continue antihistamine of choice.  Would avoid decongestant when possible secondary to hypertension.  Blood pressures are well controlled today. Start Flonase nasal spray 1 spray each nare twice daily. Encouraged lymph massage No signs of infection today.  Reassured patient this would eventually resolve with Flonase and antihistamine use.  If worsens or he feels its becoming infected can certainly see back or refer to ENT at that time.  Reviewed expectations re: course of current medical issues. Discussed self-management of symptoms. Outlined signs and symptoms indicating need for more acute intervention. Patient verbalized understanding and all questions  were answered. Patient received an After-Visit Summary.    No orders of the defined types were placed in this encounter.  Meds ordered this encounter  Medications   fluticasone (FLONASE) 50 MCG/ACT nasal spray    Sig: Place 2 sprays into both nostrils daily.    Dispense:  16 g    Refill:  6   Referral Orders  No referral(s) requested today     Note is dictated utilizing voice recognition software. Although note has  been proof read prior to signing, occasional typographical errors still can be missed. If any questions arise, please do not hesitate to call for verification.   electronically signed by:  Howard Pouch, DO  [place]

## 2020-09-23 NOTE — Patient Instructions (Signed)
Eustachian Tube Dysfunction  Eustachian tube dysfunction refers to a condition in which a blockage develops in the narrow passage that connects the middle ear to the back of the nose (eustachian tube). The eustachian tube regulates air pressure in the middle ear by letting air move between the ear and nose. It also helps to drain fluid from the middle earspace. Eustachian tube dysfunction can affect one or both ears. When the eustachian tube does not function properly, air pressure, fluid, or both can build up inthe middle ear. What are the causes? This condition occurs when the eustachian tube becomes blocked or cannot open normally. Common causes of this condition include: Ear infections. Colds and other infections that affect the nose, mouth, and throat (upper respiratory tract). Allergies. Irritation from cigarette smoke. Irritation from stomach acid coming up into the esophagus (gastroesophageal reflux). The esophagus is the tube that carries food from the mouth to the stomach. Sudden changes in air pressure, such as from descending in an airplane or scuba diving. Abnormal growths in the nose or throat, such as: Growths that line the nose (nasal polyps). Abnormal growth of cells (tumors). Enlarged tissue at the back of the throat (adenoids). What increases the risk? You are more likely to develop this condition if: You smoke. You are overweight. You are a child who has: Certain birth defects of the mouth, such as cleft palate. Large tonsils or adenoids. What are the signs or symptoms? Common symptoms of this condition include: A feeling of fullness in the ear. Ear pain. Clicking or popping noises in the ear. Ringing in the ear. Hearing loss. Loss of balance. Dizziness. Symptoms may get worse when the air pressure around you changes, such as whenyou travel to an area of high elevation, fly on an airplane, or go scuba diving. How is this diagnosed? This condition may be diagnosed  based on: Your symptoms. A physical exam of your ears, nose, and throat. Tests, such as those that measure: The movement of your eardrum (tympanogram). Your hearing (audiometry). How is this treated? Treatment depends on the cause and severity of your condition. In mild cases, you may relieve your symptoms by moving air into your ears. This is called "popping the ears." In more severe cases, or if you have symptoms of fluid in your ears, treatment may include: Medicines to relieve congestion (decongestants). Medicines that treat allergies (antihistamines). Nasal sprays or ear drops that contain medicines that reduce swelling (steroids). A procedure to drain the fluid in your eardrum (myringotomy). In this procedure, a small tube is placed in the eardrum to: Drain the fluid. Restore the air in the middle ear space. A procedure to insert a balloon device through the nose to inflate the opening of the eustachian tube (balloon dilation). Follow these instructions at home: Lifestyle Do not do any of the following until your health care provider approves: Travel to high altitudes. Fly in airplanes. Work in a pressurized cabin or room. Scuba dive. Do not use any products that contain nicotine or tobacco, such as cigarettes and e-cigarettes. If you need help quitting, ask your health care provider. Keep your ears dry. Wear fitted earplugs during showering and bathing. Dry your ears completely after. General instructions Take over-the-counter and prescription medicines only as told by your health care provider. Use techniques to help pop your ears as recommended by your health care provider. These may include: Chewing gum. Yawning. Frequent, forceful swallowing. Closing your mouth, holding your nose closed, and gently blowing as if you   are trying to blow air out of your nose. Keep all follow-up visits as told by your health care provider. This is important. Contact a health care provider  if: Your symptoms do not go away after treatment. Your symptoms come back after treatment. You are unable to pop your ears. You have: A fever. Pain in your ear. Pain in your head or neck. Fluid draining from your ear. Your hearing suddenly changes. You become very dizzy. You lose your balance. Summary Eustachian tube dysfunction refers to a condition in which a blockage develops in the eustachian tube. It can be caused by ear infections, allergies, inhaled irritants, or abnormal growths in the nose or throat. Symptoms include ear pain, hearing loss, or ringing in the ears. Mild cases are treated with maneuvers to unblock the ears, such as yawning or ear popping. Severe cases are treated with medicines. Surgery may also be done (rare). This information is not intended to replace advice given to you by your health care provider. Make sure you discuss any questions you have with your healthcare provider. Document Revised: 06/21/2017 Document Reviewed: 06/21/2017 Elsevier Patient Education  2022 [employer].  

## 2020-09-30 ENCOUNTER — Other Ambulatory Visit: Payer: Self-pay | Admitting: Dermatology

## 2020-10-13 ENCOUNTER — Other Ambulatory Visit: Payer: Self-pay

## 2020-10-14 ENCOUNTER — Ambulatory Visit (INDEPENDENT_AMBULATORY_CARE_PROVIDER_SITE_OTHER): Payer: 59 | Admitting: Family Medicine

## 2020-10-14 ENCOUNTER — Encounter: Payer: Self-pay | Admitting: Family Medicine

## 2020-10-14 VITALS — BP 133/83 | HR 58 | Temp 98.0°F | Ht 71.0 in | Wt 255.0 lb

## 2020-10-14 DIAGNOSIS — Z125 Encounter for screening for malignant neoplasm of prostate: Secondary | ICD-10-CM | POA: Diagnosis not present

## 2020-10-14 DIAGNOSIS — E782 Mixed hyperlipidemia: Secondary | ICD-10-CM | POA: Diagnosis not present

## 2020-10-14 DIAGNOSIS — I10 Essential (primary) hypertension: Secondary | ICD-10-CM

## 2020-10-14 DIAGNOSIS — Z131 Encounter for screening for diabetes mellitus: Secondary | ICD-10-CM | POA: Diagnosis not present

## 2020-10-14 DIAGNOSIS — Z23 Encounter for immunization: Secondary | ICD-10-CM | POA: Diagnosis not present

## 2020-10-14 DIAGNOSIS — G4733 Obstructive sleep apnea (adult) (pediatric): Secondary | ICD-10-CM

## 2020-10-14 DIAGNOSIS — Z0000 Encounter for general adult medical examination without abnormal findings: Secondary | ICD-10-CM | POA: Diagnosis not present

## 2020-10-14 DIAGNOSIS — D696 Thrombocytopenia, unspecified: Secondary | ICD-10-CM

## 2020-10-14 DIAGNOSIS — R946 Abnormal results of thyroid function studies: Secondary | ICD-10-CM

## 2020-10-14 DIAGNOSIS — K760 Fatty (change of) liver, not elsewhere classified: Secondary | ICD-10-CM

## 2020-10-14 LAB — CBC WITH DIFFERENTIAL/PLATELET
Basophils Absolute: 0 10*3/uL (ref 0.0–0.1)
Basophils Relative: 0.6 % (ref 0.0–3.0)
Eosinophils Absolute: 0.1 10*3/uL (ref 0.0–0.7)
Eosinophils Relative: 1.5 % (ref 0.0–5.0)
HCT: 49.6 % (ref 39.0–52.0)
Hemoglobin: 16.7 g/dL (ref 13.0–17.0)
Lymphocytes Relative: 26.7 % (ref 12.0–46.0)
Lymphs Abs: 2 10*3/uL (ref 0.7–4.0)
MCHC: 33.6 g/dL (ref 30.0–36.0)
MCV: 89.7 fl (ref 78.0–100.0)
Monocytes Absolute: 0.7 10*3/uL (ref 0.1–1.0)
Monocytes Relative: 9 % (ref 3.0–12.0)
Neutro Abs: 4.6 10*3/uL (ref 1.4–7.7)
Neutrophils Relative %: 62.2 % (ref 43.0–77.0)
Platelets: 134 10*3/uL — ABNORMAL LOW (ref 150.0–400.0)
RBC: 5.52 Mil/uL (ref 4.22–5.81)
RDW: 13.7 % (ref 11.5–15.5)
WBC: 7.4 10*3/uL (ref 4.0–10.5)

## 2020-10-14 LAB — LIPID PANEL
Cholesterol: 157 mg/dL (ref 0–200)
HDL: 35.1 mg/dL — ABNORMAL LOW (ref >39.00)
NonHDL: 121.73
Total CHOL/HDL Ratio: 4
Triglycerides: 367 mg/dL — ABNORMAL HIGH (ref 0.0–149.0)
VLDL: 73.4 mg/dL — ABNORMAL HIGH (ref 0.0–40.0)

## 2020-10-14 LAB — COMPREHENSIVE METABOLIC PANEL
ALT: 39 U/L (ref 0–53)
AST: 25 U/L (ref 0–37)
Albumin: 4.7 g/dL (ref 3.5–5.2)
Alkaline Phosphatase: 43 U/L (ref 39–117)
BUN: 26 mg/dL — ABNORMAL HIGH (ref 6–23)
CO2: 28 mEq/L (ref 19–32)
Calcium: 9.8 mg/dL (ref 8.4–10.5)
Chloride: 98 mEq/L (ref 96–112)
Creatinine, Ser: 1.03 mg/dL (ref 0.40–1.50)
GFR: 83.14 mL/min (ref >60.00)
Glucose, Bld: 97 mg/dL (ref 70–99)
Potassium: 4.8 mEq/L (ref 3.5–5.1)
Sodium: 136 mEq/L (ref 135–145)
Total Bilirubin: 1.2 mg/dL (ref 0.2–1.2)
Total Protein: 7.4 g/dL (ref 6.0–8.3)

## 2020-10-14 LAB — TSH: TSH: 4.08 u[IU]/mL (ref 0.35–5.50)

## 2020-10-14 LAB — PSA: PSA: 2.67 ng/mL (ref 0.10–4.00)

## 2020-10-14 LAB — LDL CHOLESTEROL, DIRECT: Direct LDL: 58 mg/dL

## 2020-10-14 LAB — HEMOGLOBIN A1C: Hgb A1c MFr Bld: 5.7 % (ref 4.6–6.5)

## 2020-10-14 MED ORDER — ATORVASTATIN CALCIUM 20 MG PO TABS: 20.0000 mg | ORAL_TABLET | Freq: Every day | ORAL | 3 refills | Status: DC

## 2020-10-14 MED ORDER — MELOXICAM 15 MG PO TABS: 15.0000 mg | ORAL_TABLET | Freq: Every day | ORAL | 1 refills | Status: DC

## 2020-10-14 MED ORDER — AMLODIPINE BESYLATE 10 MG PO TABS: 10.0000 mg | ORAL_TABLET | Freq: Every day | ORAL | 1 refills | Status: DC

## 2020-10-14 MED ORDER — LISINOPRIL-HYDROCHLOROTHIAZIDE 20-12.5 MG PO TABS
2.0000 | ORAL_TABLET | Freq: Every day | ORAL | 1 refills | Status: DC
Start: 2020-10-14 — End: 2021-03-23

## 2020-10-14 NOTE — Patient Instructions (Signed)
Health Maintenance, Male Adopting a healthy lifestyle and getting preventive care are important in promoting health and wellness. Ask your health care provider about: The right schedule for you to have regular tests and exams. Things you can do on your own to prevent diseases and keep yourself healthy. What should I know about diet, weight, and exercise? Eat a healthy diet  Eat a diet that includes plenty of vegetables, fruits, low-fat dairy products, and lean protein. Do not eat a lot of foods that are high in solid fats, added sugars, or sodium.  Maintain a healthy weight Body mass index (BMI) is a measurement that can be used to identify possible weight problems. It estimates body fat based on height and weight. Your health care provider can help determine your BMI and help you achieve or maintain ahealthy weight. Get regular exercise Get regular exercise. This is one of the most important things you can do for your health. Most adults should: Exercise for at least 150 minutes each week. The exercise should increase your heart rate and make you sweat (moderate-intensity exercise). Do strengthening exercises at least twice a week. This is in addition to the moderate-intensity exercise. Spend less time sitting. Even light physical activity can be beneficial. Watch cholesterol and blood lipids Have your blood tested for lipids and cholesterol at 53 years of age, then havethis test every 5 years. You may need to have your cholesterol levels checked more often if: Your lipid or cholesterol levels are high. You are older than 53 years of age. You are at high risk for heart disease. What should I know about cancer screening? Many types of cancers can be detected early and may often be prevented. Depending on your health history and family history, you may need to have cancer screening at various ages. This may include screening for: Colorectal cancer. Prostate cancer. Skin cancer. Lung  cancer. What should I know about heart disease, diabetes, and high blood pressure? Blood pressure and heart disease High blood pressure causes heart disease and increases the risk of stroke. This is more likely to develop in people who have high blood pressure readings, are of African descent, or are overweight. Talk with your health care provider about your target blood pressure readings. Have your blood pressure checked: Every 3-5 years if you are 18-39 years of age. Every year if you are 40 years old or older. If you are between the ages of 65 and 75 and are a current or former smoker, ask your health care provider if you should have a one-time screening for abdominal aortic aneurysm (AAA). Diabetes Have regular diabetes screenings. This checks your fasting blood sugar level. Have the screening done: Once every three years after age 45 if you are at a normal weight and have a low risk for diabetes. More often and at a younger age if you are overweight or have a high risk for diabetes. What should I know about preventing infection? Hepatitis B If you have a higher risk for hepatitis B, you should be screened for this virus. Talk with your health care provider to find out if you are at risk forhepatitis B infection. Hepatitis C Blood testing is recommended for: Everyone born from 1945 through 1965. Anyone with known risk factors for hepatitis C. Sexually transmitted infections (STIs) You should be screened each year for STIs, including gonorrhea and chlamydia, if: You are sexually active and are younger than 53 years of age. You are older than 53 years of age   and your health care provider tells you that you are at risk for this type of infection. Your sexual activity has changed since you were last screened, and you are at increased risk for chlamydia or gonorrhea. Ask your health care provider if you are at risk. Ask your health care provider about whether you are at high risk for HIV.  Your health care provider may recommend a prescription medicine to help prevent HIV infection. If you choose to take medicine to prevent HIV, you should first get tested for HIV. You should then be tested every 3 months for as long as you are taking the medicine. Follow these instructions at home: Lifestyle Do not use any products that contain nicotine or tobacco, such as cigarettes, e-cigarettes, and chewing tobacco. If you need help quitting, ask your health care provider. Do not use street drugs. Do not share needles. Ask your health care provider for help if you need support or information about quitting drugs. Alcohol use Do not drink alcohol if your health care provider tells you not to drink. If you drink alcohol: Limit how much you have to 0-2 drinks a day. Be aware of how much alcohol is in your drink. In the [place] one drink equals one 12 oz bottle of beer (355 mL), one 5 oz glass of wine (148 mL), or one 1 oz glass of hard liquor (44 mL). General instructions Schedule regular health, dental, and eye exams. Stay current with your vaccines. Tell your health care provider if: You often feel depressed. You have ever been abused or do not feel safe at home. Summary Adopting a healthy lifestyle and getting preventive care are important in promoting health and wellness. Follow your health care provider's instructions about healthy diet, exercising, and getting tested or screened for diseases. Follow your health care provider's instructions on monitoring your cholesterol and blood pressure. This information is not intended to replace advice given to you by your health care provider. Make sure you discuss any questions you have with your healthcare provider. Document Revised: 02/22/2018 Document Reviewed: 02/22/2018 Elsevier Patient Education  2022 [employer].  

## 2020-10-14 NOTE — Progress Notes (Signed)
This visit occurred during the SARS-CoV-2 public health emergency.  Safety protocols were in place, including screening questions prior to the visit, additional usage of staff PPE, and extensive cleaning of exam room while observing appropriate contact time as indicated for disinfecting solutions.    Patient ID: Jacob Aguirre, male  DOB: March 29, 1965, 55 y.o.   MRN: [id number] Patient Care Team    Relationship Specialty Notifications Start End  Ma Hillock, DO PCP - General Family Medicine  07/12/17   Nahser, Wonda Cheng, MD  Cardiology  08/27/11   Deneise Lever, MD Consulting Physician Pulmonary Disease  07/12/17   Lavonna Monarch, MD Consulting Physician Dermatology  02/14/20   Lavena Bullion, DO Consulting Physician Gastroenterology  10/14/20     Chief Complaint  Patient presents with   Annual Exam    Pt is fasting    Subjective:  ARCANGELO Aguirre is a 55 y.o. male present for [place]. All past medical history, surgical history, allergies, family history, immunizations, medications and social history were updated in the electronic medical record today. All recent labs, ED visits and hospitalizations within the last year were reviewed.  Health maintenance:  Colonoscopy: last screen 02/15/2018, recommend follow up 10 y. Completed by Dr. Jetta Lout. Immunizations:  tdap administered today, influenza UTD, shingrix #1 provided today. Covid declined Infectious disease screening: HIV and Hep C completed PSA:  Lab Results  Component Value Date   PSA 2.8 08/22/2018  , pt was counseled on prostate cancer screenings.  Assistive device: none Oxygen [id number] Patient has a Dental home. Hospitalizations/ED visits: reviewed  Hypertension/hyperlipidemia/obesity: Pt reports compliance  with lisinopril 40 mg daily and amlodipine 10 mg qd. Patient denies chest pain, shortness of breath, dizziness or lower extremity edema.   Patient has Lipitor and is tolerating. RF: Hypertension,  hypertriglyceridemia, obesity.  Family history of stroke in his father.   Hepatic steatosis/elevated liver enzymes/thrombocytopenia:  He is tolerating low dose Lipitor. He is established with heme for possible mild ITP. PLTs had improved.  Prior note: Elevated liver enzymes reported by his health at work labs.  After receiving his health at work papers we discontinued his Voltaren and Lipitor.  Timeline suggest Voltaren may have increased his LFTs in 2019, however they had been mildly elevated even up into that point.  He does report he is taking omega-3's and increasing his fiber now for his cholesterol.  His repeat lipid panel last month looked great with no statin.  His father recently had a stroke and patient will benefit from adding back on cholesterol medicine for CV protection when able.  He states he has started a Mediterranean diet, he is no longer eating red meat.  He has started the Mobic in place of the Voltaren and feels it is working rather well to even better. His platelets had decreased 1 month ago and were commented on being enlarged.  This is a new finding for him.    Arthritis: Patient reports the mobic has worked well for his arthritis, no complaints.  Depression screen [facility] 2/9 10/14/2020 04/29/2020 01/25/2019 07/12/2017  Decreased Interest 0 0 0 0  Down, Depressed, Hopeless 0 0 0 0  PHQ - 2 Score 0 0 0 0   No flowsheet data found.      Fall Risk  07/12/2017  Falls in the past year? No    Immunization History  Administered Date(s) Administered   Influenza Whole 12/14/2011   Influenza,inj,Quad PF,6+ Mos 01/12/2013, 12/15/2018, 04/29/2020   Influenza-Unspecified  12/13/2013, 11/29/2017   Td 08/05/2003   Tdap 09/15/2010, 10/14/2020   Zoster Recombinat (Shingrix) 10/14/2020     Past Medical History:  Diagnosis Date   Gout    Right large toe   Hematuria    pt reports chronic.    HTN (hypertension)    Dr. Acie Fredrickson   Hypercholesterolemia    Meniscal injury 1990   OSA  (obstructive sleep apnea)    wears CPAP; Dr. Annamaria Boots   Sleep apnea    cpap   Tinea corporis    Allergies  Allergen Reactions   Erythromycin    Niacin And Related     Hives   Penicillins     Causes Hives   Past Surgical History:  Procedure Laterality Date   FINGER SURGERY     KNEE ARTHROSCOPY W/ MENISCAL REPAIR Right 1990   KNEE SURGERY     Broken Knee Cap   pilonidal cyst resection     POPLITEAL SYNOVIAL CYST EXCISION Right    3rd grade   Family History  Problem Relation Age of Onset   Heart murmur Father    Hearing loss Father    Colon polyps Father    Stroke Father    Hypertension Mother    COPD Mother    Testicular cancer Brother 66       And prostate cancer   Colon cancer Neg Hx    Esophageal cancer Neg Hx    Rectal cancer Neg Hx    Stomach cancer Neg Hx    Social History   Social History Narrative   Married.   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    Nonsmoker.   Drinks caffeine.    Smoke alarm in the home. Wears her seatbelt.    Owns firearms.    Feels safe in his relationships.     Allergies as of 10/14/2020       Reactions   Erythromycin    Niacin And Related    Hives   Penicillins    Causes Hives        Medication List        Accurate as of October 14, 2020  8:36 AM. If you have any questions, ask your nurse or doctor.          alclomethasone 0.05 % cream Commonly known as: ACLOVATE APPLY TO AFFECTED AREA EVERY DAY TO TWICE A DAY   amLODipine 10 MG tablet Commonly known as: NORVASC Take 1 tablet (10 mg total) by mouth daily.   atorvastatin 20 MG tablet Commonly known as: LIPITOR Take 1 tablet (20 mg total) by mouth daily.   clobetasol cream 0.05 % Commonly known as: TEMOVATE APPLY TO AFFECTED AREA 1-2 TIMES DAILY   fluticasone 50 MCG/ACT nasal spray Commonly known as: FLONASE Place 2 sprays into both nostrils daily.   lisinopril-hydrochlorothiazide 20-12.5 MG tablet Commonly known as: ZESTORETIC Take 2 tablets by mouth  daily.   meloxicam 15 MG tablet Commonly known as: MOBIC Take 1 tablet (15 mg total) by mouth daily.       All past medical history, surgical history, allergies, family history, immunizations andmedications were updated in the EMR today and reviewed under the history and medication portions of their EMR.     No results found for this or any previous visit (from the past 2160 hour(s)).    ROS: 14 pt review of systems performed and negative (unless mentioned in an HPI)  Objective: BP 133/83   Pulse (!) 58   Temp 98  F (36.7 C) (Oral)   Ht '5\' 11"'$  (1.803 m)   Wt 255 lb (115.7 kg)   SpO2 96%   BMI 35.57 kg/m  Gen: Afebrile. No acute distress. Nontoxic in appearance, well-developed, well-nourished,  pleasant obese male.  HENT: AT. [place]. Bilateral TM visualized and normal in appearance, normal external auditory canal. MMM, no oral lesions, adequate dentition. Bilateral nares within normal limits. Throat without erythema, ulcerations or exudates. no Cough on exam, no hoarseness on exam. Eyes:Pupils Equal Round Reactive to light, Extraocular movements intact,  Conjunctiva without redness, discharge or icterus. Neck/lymp/endocrine: Supple,no lymphadenopathy, no thyromegaly CV: RRR no murmur, no edema, +2/4 P posterior tibialis pulses.  Chest: CTAB, no wheeze, rhonchi or crackles. normal Respiratory effort. good Air movement. Abd: Soft. flat. NTND. BS present. no Masses palpated. No hepatosplenomegaly. No rebound tenderness or guarding. Skin: no rashes, purpura or petechiae. Warm and well-perfused. Skin intact. Neuro/Msk:  Normal gait. PERLA. EOMi. Alert. Oriented x3.  Cranial nerves II through XII intact. Muscle strength 5/5 upper/lower extremity. DTRs equal bilaterally. Psych: Normal affect, dress and demeanor. Normal speech. Normal thought content and judgment.  No results found.  Assessment/plan: SUHEYB VERHAGEN is a 55 y.o. male present for CPE/CMC Mixed hyperlipidemia/Essential  hypertension/morbid obesity Stable.  Continue lisinopril 20 mg - hctz 12.5 mg (2 TABS) daily. Continue Amlodipine 10 mg qd> may be causing some LE edema. Continue Lipitor 20 mg nightly for CV protection with family history of stroke in his father Cbc, cmp, tsh, lipid.  - continue fish oil as long as plt remain stable  -Continue higher fiber diet/Metamucil - Fhx of recent stroke in his father. Follow-up 5.5 months   Arthritis/knee pain:  Stable.  Continue  Mobic 15 mg daily> may be causing mild edema. Follow-up 5.5 months   Elevated LFTs/hepatic steatosis>resolved off Voltaren and statin -Hepatic steatosis on ultrasound -HIV, hepatitis panel, anti-smooth muscle antibody negative. -Lft he is returned to normal off diclofenac.  Thrombocytopenia/platelet enlargement: Est w/ heme OSA with CPAP: Established w/ pulm and has cpap supplies. Need for zoster vaccination - Varicella-zoster vaccine IM Need for Tdap vaccination - Tdap vaccine greater than or equal to 7yo IM Abnormal thyroid function test Tsh collected today Diabetes mellitus screening - Hemoglobin A1c Prostate cancer screening - PSA Routine general medical examination at a health care facility Colonoscopy: last screen 02/15/2018, recommend follow up 10 y. Completed by Dr. Jetta Lout. Immunizations:  tdap administered today, influenza UTD, shingrix #1 provided today. Covid declined Infectious disease screening: HIV and Hep C completed Patient was encouraged to exercise greater than 150 minutes a week. Patient was encouraged to choose a diet filled with fresh fruits and vegetables, and lean meats. AVS provided to patient today for education/recommendation on gender specific health and safety maintenance.  Return in about 6 months (around 04/06/2021) for CMC (30 min) and 63yrd cpe.  Orders Placed This Encounter  Procedures   Tdap vaccine greater than or equal to 7yo IM   Varicella-zoster vaccine IM   CBC with  Differential/Platelet   Comprehensive metabolic panel   Hemoglobin A1c   Lipid panel   PSA   TSH    Orders Placed This Encounter  Procedures   Tdap vaccine greater than or equal to 7yo IM   Varicella-zoster vaccine IM   CBC with Differential/Platelet   Comprehensive metabolic panel   Hemoglobin A1c   Lipid panel   PSA   TSH    Meds ordered this encounter  Medications   meloxicam (MOBIC) 15  MG tablet    Sig: Take 1 tablet (15 mg total) by mouth daily.    Dispense:  90 tablet    Refill:  1   lisinopril-hydrochlorothiazide (ZESTORETIC) 20-12.5 MG tablet    Sig: Take 2 tablets by mouth daily.    Dispense:  180 tablet    Refill:  1   atorvastatin (LIPITOR) 20 MG tablet    Sig: Take 1 tablet (20 mg total) by mouth daily.    Dispense:  90 tablet    Refill:  3   amLODipine (NORVASC) 10 MG tablet    Sig: Take 1 tablet (10 mg total) by mouth daily.    Dispense:  90 tablet    Refill:  1    Referral Orders  No referral(s) requested today     Note is dictated utilizing voice recognition software. Although note has been proof read prior to signing, occasional typographical errors still can be missed. If any questions arise, please do not hesitate to call for verification.  Electronically signed by: Howard Pouch, DO [place]

## 2020-10-15 ENCOUNTER — Telehealth: Payer: Self-pay | Admitting: Family Medicine

## 2020-10-15 DIAGNOSIS — E782 Mixed hyperlipidemia: Secondary | ICD-10-CM

## 2020-10-15 IMAGING — US US ABDOMEN COMPLETE
1 series · 13 of 25 positions shown · non-contrast
Comparison: None.

CLINICAL DATA: Elevated liver enzymes

EXAM:
ABDOMEN ULTRASOUND COMPLETE

[Series 1: us abdomen complete · 0.27mm/px · 13 of 77 slices shown]
[im 1/77]
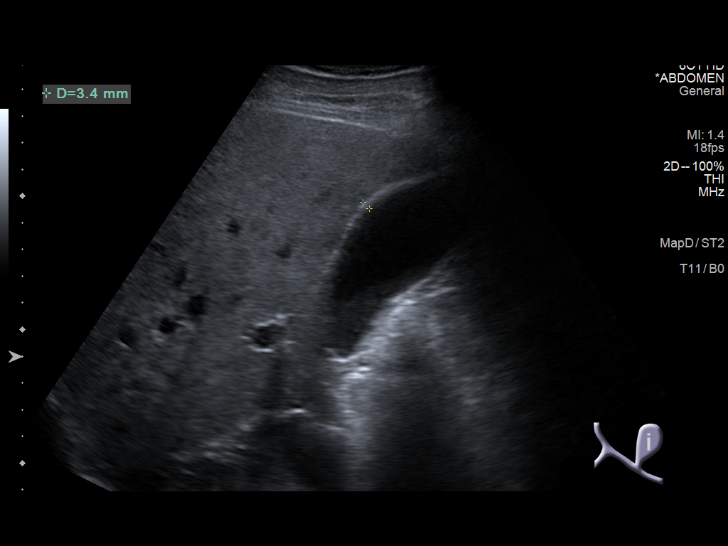
[im 7/77]
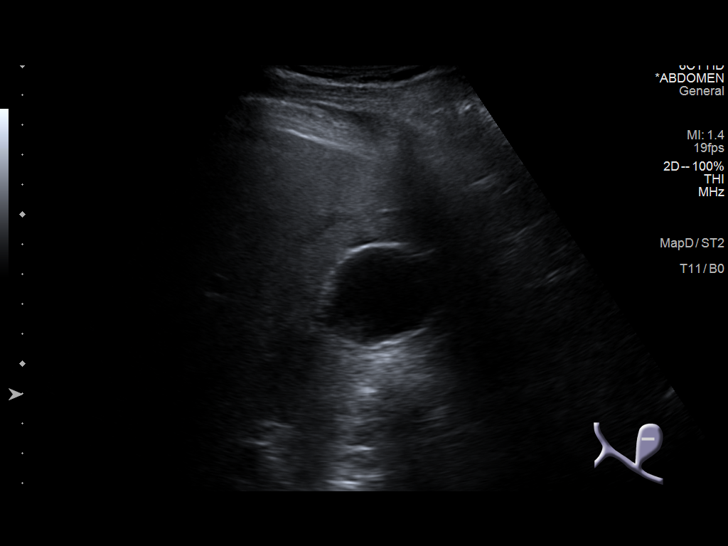
[im 13/77]
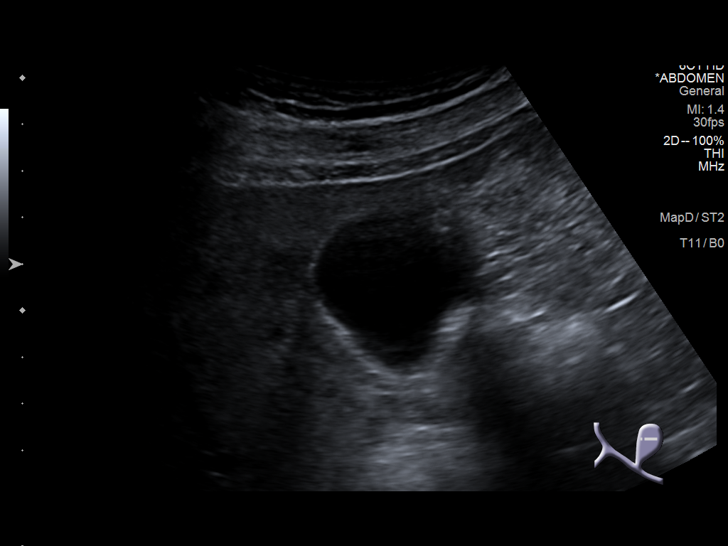
[im 20/77]
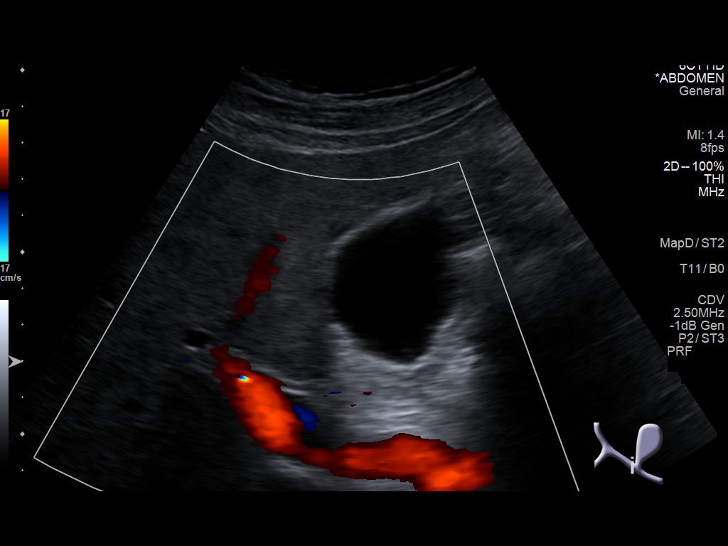
[im 26/77]
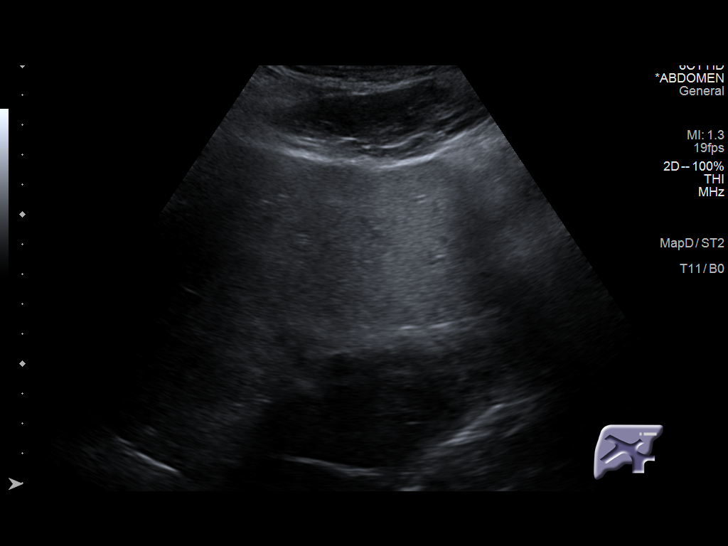
[im 32/77]
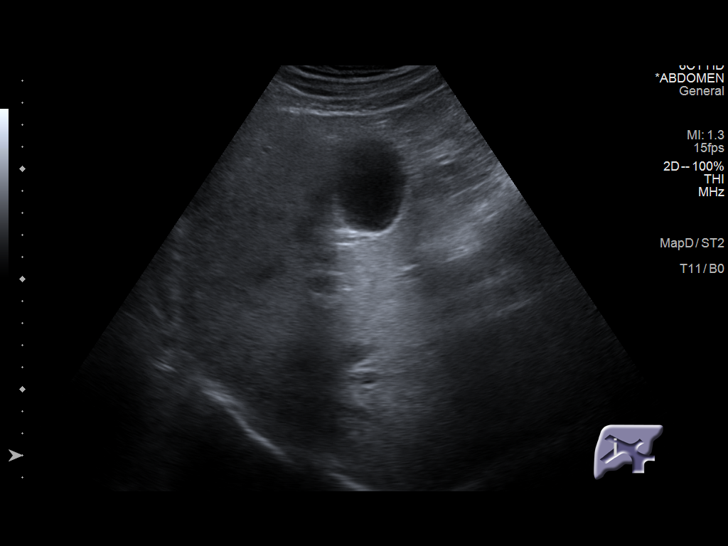
[im 39/77]
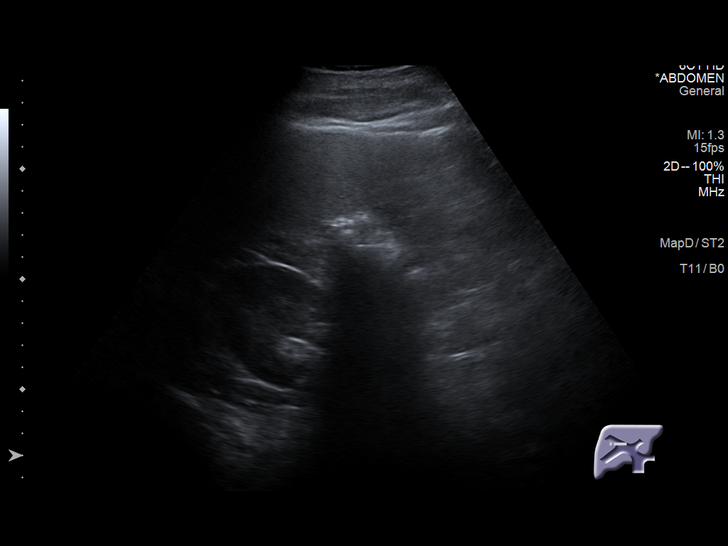
[im 45/77]
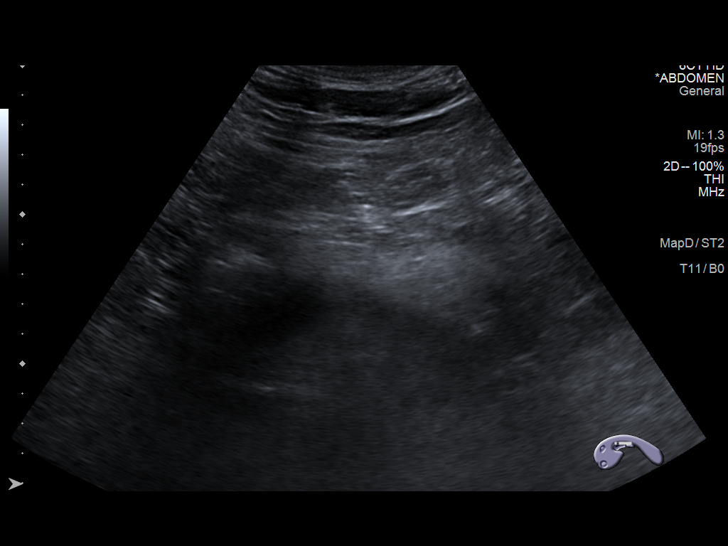
[im 51/77]
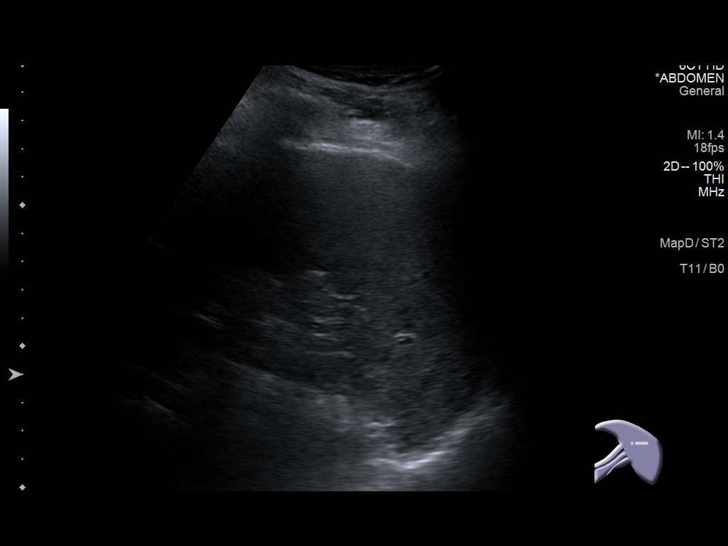
[im 58/77]
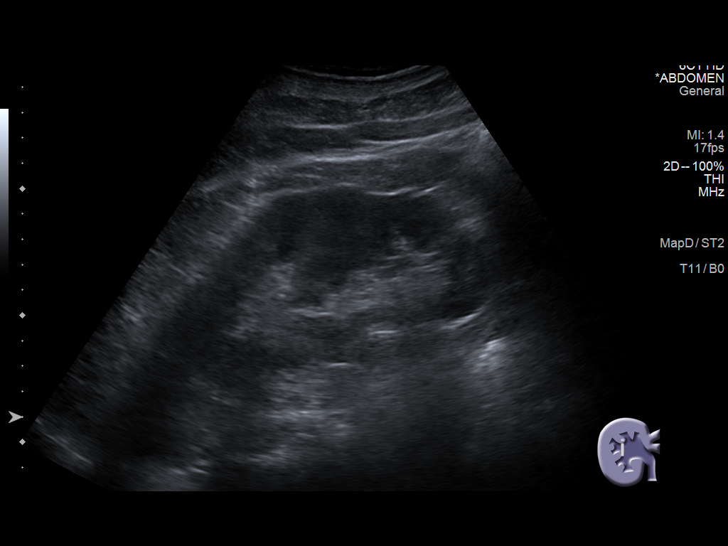
[im 64/77]
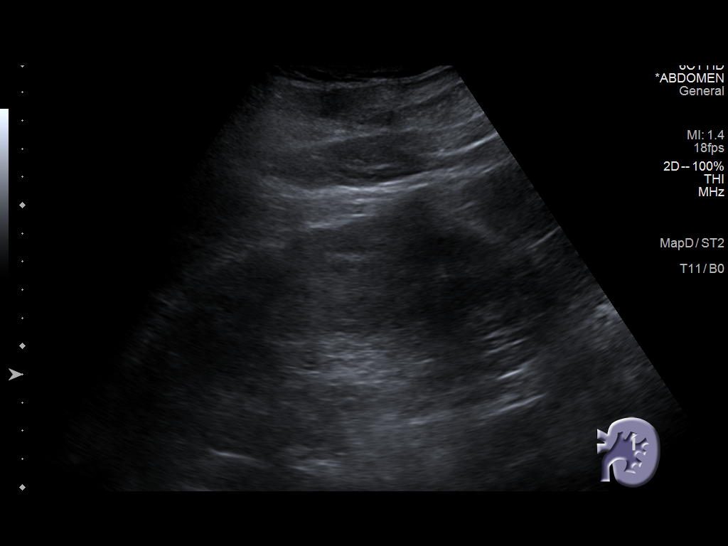
[im 70/77]
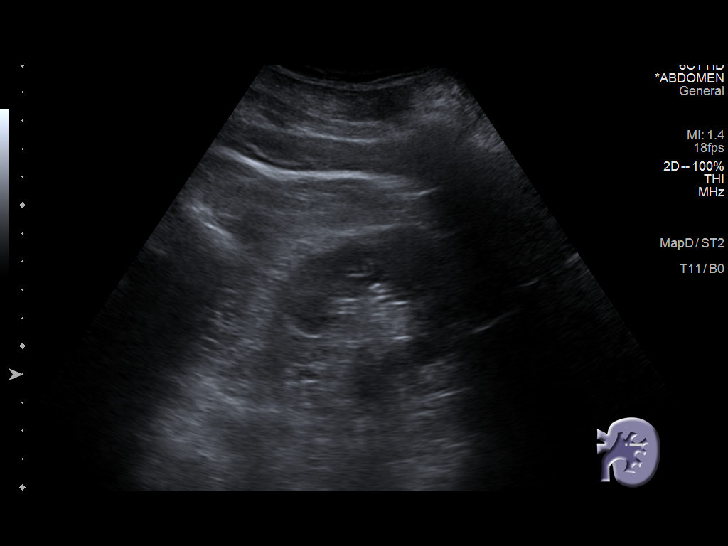
[im 77/77]
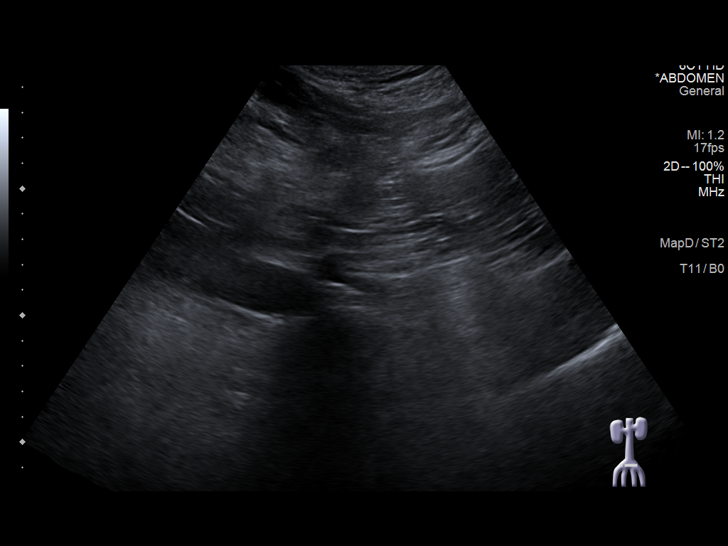

[13 of 25 positions shown; findings below may reference images not displayed]

FINDINGS: Gallbladder: No gallstones or wall thickening visualized. There is
no pericholecystic fluid. There is mild sludge in the gallbladder.
No sonographic Murphy sign noted by sonographer.

Common bile duct: Diameter: 2 mm. No intrahepatic, common hepatic,
or common bile duct dilatation.

Liver: No focal lesion identified. Liver echogenicity overall is
increased. Portal vein is patent on color Doppler imaging with
normal direction of blood flow towards the liver.

IVC: No abnormality visualized.

Pancreas: Visualized portion unremarkable. Portions of pancreas
obscured by gas.

Spleen: Size and appearance within normal limits.

Right Kidney: Length: 13.2 cm. Echogenicity within normal limits. No
mass or hydronephrosis visualized.

Left Kidney: Length: 12.6 cm. Echogenicity within normal limits. No
mass or hydronephrosis visualized.

Abdominal aorta: No aneurysm visualized.

Other findings: No demonstrable ascites.
IMPRESSION: 1. Increase in liver echogenicity, a finding indicative of hepatic
steatosis. No focal liver lesions evident. It should be noted that
the sensitivity of ultrasound for detection of focal liver lesions
is somewhat diminished in this circumstance.

2. Mild sludge in gallbladder. Gallbladder otherwise appears
unremarkable.

3. Portions of pancreas obscured by gas. Visualized portions of
pancreas appear unremarkable.

4.  Study otherwise unremarkable.

## 2020-10-15 MED ORDER — FENOFIBRATE 145 MG PO TABS
ORAL_TABLET | ORAL | 1 refills | Status: DC
Start: 2020-10-15 — End: 2021-03-23

## 2020-10-15 NOTE — Telephone Encounter (Signed)
Spoke with pt regarding labs and instructions.   

## 2020-10-15 NOTE — Telephone Encounter (Signed)
Please call patient Jacob Aguirre, Jacob Aguirre and Jacob Aguirre function are normal Blood cell counts are stable and electrolytes are normal Diabetes screening/A1c is normal at 5.7 Prostate cancer screening is normal Cholesterol panel : His bad cholesterol/LDL levels look excellent and are at goal for him at 58.  His triglycerides are still significantly elevated at 367.  Goal for triglycerides are less than 150.    -of course we recommend increasing physical exercise and a diet low in sugars, alcohols, butter, oils and fatty meats will also lower triglycerides.  Increasing fiber in the diet with fresh vegetables is helpful.   -I do recommend we start a medication called fenofibrate in addition to the statin he is taking.  Fenofibrate is a fibric based medicine and concentrates on decreasing the triglycerides.    8 weeks lab appointment only-fasting, after starting fenofibrate.  The schedule is for him.  Orders have been placed

## 2020-12-16 ENCOUNTER — Ambulatory Visit: Payer: 59

## 2020-12-19 ENCOUNTER — Ambulatory Visit (INDEPENDENT_AMBULATORY_CARE_PROVIDER_SITE_OTHER): Payer: 59

## 2020-12-19 ENCOUNTER — Other Ambulatory Visit: Payer: Self-pay

## 2020-12-19 DIAGNOSIS — E782 Mixed hyperlipidemia: Secondary | ICD-10-CM | POA: Diagnosis not present

## 2020-12-19 LAB — COMPREHENSIVE METABOLIC PANEL
ALT: 33 U/L (ref 0–53)
AST: 22 U/L (ref 0–37)
Albumin: 4.4 g/dL (ref 3.5–5.2)
Alkaline Phosphatase: 40 U/L (ref 39–117)
BUN: 25 mg/dL — ABNORMAL HIGH (ref 6–23)
CO2: 28 mEq/L (ref 19–32)
Calcium: 9.6 mg/dL (ref 8.4–10.5)
Chloride: 99 mEq/L (ref 96–112)
Creatinine, Ser: 1.03 mg/dL (ref 0.40–1.50)
GFR: 83.04 mL/min (ref >60.00)
Glucose, Bld: 86 mg/dL (ref 70–99)
Potassium: 4.2 mEq/L (ref 3.5–5.1)
Sodium: 137 mEq/L (ref 135–145)
Total Bilirubin: 0.7 mg/dL (ref 0.2–1.2)
Total Protein: 6.9 g/dL (ref 6.0–8.3)

## 2020-12-19 LAB — LIPID PANEL
Cholesterol: 171 mg/dL (ref 0–200)
HDL: 35.1 mg/dL — ABNORMAL LOW (ref >39.00)
NonHDL: 136.27
Total CHOL/HDL Ratio: 5
Triglycerides: 251 mg/dL — ABNORMAL HIGH (ref 0.0–149.0)
VLDL: 50.2 mg/dL — ABNORMAL HIGH (ref 0.0–40.0)

## 2020-12-19 LAB — LDL CHOLESTEROL, DIRECT: Direct LDL: 106 mg/dL

## 2020-12-24 ENCOUNTER — Telehealth: Payer: Self-pay | Admitting: Family Medicine

## 2020-12-24 NOTE — Telephone Encounter (Signed)
LM for pt to return call to discuss.  MyChart message sent with results.  

## 2020-12-24 NOTE — Telephone Encounter (Signed)
Please inform patient: His triglycerides her improved from his August labs.  They were 367, now 251.  However normal is less than 150. I would encourage him to continue fenofibrate daily.   Continue Lipitor 20 mg nightly.  Work on routine exercise at least 150 minutes a week and a low saturated diet, full of fresh fruits and vegetables with lean meats.  Avoiding butter, red meats, fatty meats and oily meals.  I would also encourage him to head 2 servings of psyllium/Metamucil daily to this regimen to help lower triglycerides further if possible.

## 2020-12-25 NOTE — Telephone Encounter (Signed)
LVM for pt to CB regarding results.  

## 2020-12-26 NOTE — Telephone Encounter (Signed)
LVM for pt to CB regarding results.  

## 2021-01-13 ENCOUNTER — Other Ambulatory Visit: Payer: Self-pay | Admitting: Dermatology

## 2021-02-10 ENCOUNTER — Telehealth: Payer: Self-pay

## 2021-02-10 DIAGNOSIS — H6983 Other specified disorders of Eustachian tube, bilateral: Secondary | ICD-10-CM

## 2021-02-10 NOTE — Telephone Encounter (Signed)
Patient has been seen by Dr. Raoul Pitch a few times with ear pain/ fluid in ear.  He is having issues again.  Dr. Raoul Pitch recommended something over the counter, but he doesn't remember the name of medication. He is also requesting a referral to ENT which was mentioned previously by provider if this issue came up again.  Please call patient [phone]

## 2021-02-10 NOTE — Telephone Encounter (Signed)
Last seen 09/23/20 regarding this issue, mentioned in note:  "Discussed eustachian tube dysfunction.   Encouraged to continue antihistamine of choice.  Would avoid decongestant when possible secondary to hypertension.  Blood pressures are well controlled today. Start Flonase nasal spray 1 spray each nare twice daily. Encouraged lymph massage No signs of infection today.  Reassured patient this would eventually resolve with Flonase and antihistamine use.  If worsens or he feels its becoming infected can certainly see back or refer to ENT at that time"   Please review and advise

## 2021-02-10 NOTE — Telephone Encounter (Signed)
Flonase . Please refer to ent

## 2021-02-11 NOTE — Telephone Encounter (Signed)
Spoke with patient regarding results/recommendations.  

## 2021-02-11 NOTE — Addendum Note (Signed)
Addended by: Octaviano Glow on: 02/11/2021 09:03 AM   Modules accepted: Orders

## 2021-03-22 ENCOUNTER — Other Ambulatory Visit: Payer: Self-pay | Admitting: Family Medicine

## 2021-03-23 ENCOUNTER — Telehealth: Payer: Self-pay | Admitting: Dermatology

## 2021-03-23 ENCOUNTER — Ambulatory Visit: Payer: 59 | Admitting: Family Medicine

## 2021-03-23 NOTE — Telephone Encounter (Signed)
Phone call to patient regarding medication refill. Voicemail left informing patient that he would need to contact the office and schedule an appointment before we can refill his prescription.

## 2021-03-23 NOTE — Telephone Encounter (Signed)
Patient left message on office voice mail that he needs a refill on clobetasol cream (TEMOVATE) 0.05 % Patient wants larger size because he is going through it so fast. [id number] - [place], [place] [address] (Ph: [phone]

## 2021-03-26 ENCOUNTER — Other Ambulatory Visit: Payer: 59

## 2021-03-30 ENCOUNTER — Other Ambulatory Visit: Payer: Self-pay

## 2021-03-30 ENCOUNTER — Encounter: Payer: Self-pay | Admitting: Dermatology

## 2021-03-30 ENCOUNTER — Ambulatory Visit (INDEPENDENT_AMBULATORY_CARE_PROVIDER_SITE_OTHER): Payer: 59 | Admitting: Dermatology

## 2021-03-30 DIAGNOSIS — Z1283 Encounter for screening for malignant neoplasm of skin: Secondary | ICD-10-CM

## 2021-03-30 DIAGNOSIS — L400 Psoriasis vulgaris: Secondary | ICD-10-CM | POA: Diagnosis not present

## 2021-03-30 MED ORDER — ALCLOMETASONE DIPROPIONATE 0.05 % EX CREA: TOPICAL_CREAM | CUTANEOUS | 6 refills | Status: DC

## 2021-03-30 MED ORDER — CLOBETASOL PROPIONATE 0.05 % EX CREA: TOPICAL_CREAM | CUTANEOUS | 4 refills | Status: DC

## 2021-04-01 ENCOUNTER — Telehealth: Payer: Self-pay

## 2021-04-01 NOTE — Telephone Encounter (Signed)
Surgical clearance forms received on 04/01/21. Patient has been scheduled on 04/03/21 to surgical clearance appt. Forms have been placed 04/01/21

## 2021-04-03 ENCOUNTER — Telehealth: Payer: Self-pay | Admitting: Family Medicine

## 2021-04-03 ENCOUNTER — Ambulatory Visit (INDEPENDENT_AMBULATORY_CARE_PROVIDER_SITE_OTHER): Payer: 59 | Admitting: Family Medicine

## 2021-04-03 ENCOUNTER — Other Ambulatory Visit: Payer: Self-pay

## 2021-04-03 ENCOUNTER — Encounter: Payer: Self-pay | Admitting: Family Medicine

## 2021-04-03 VITALS — BP 114/75 | HR 74 | Temp 97.9°F | Wt 270.0 lb

## 2021-04-03 DIAGNOSIS — I10 Essential (primary) hypertension: Secondary | ICD-10-CM

## 2021-04-03 DIAGNOSIS — G4733 Obstructive sleep apnea (adult) (pediatric): Secondary | ICD-10-CM

## 2021-04-03 DIAGNOSIS — E782 Mixed hyperlipidemia: Secondary | ICD-10-CM | POA: Diagnosis not present

## 2021-04-03 DIAGNOSIS — D696 Thrombocytopenia, unspecified: Secondary | ICD-10-CM

## 2021-04-03 DIAGNOSIS — M1710 Unilateral primary osteoarthritis, unspecified knee: Secondary | ICD-10-CM

## 2021-04-03 MED ORDER — ATORVASTATIN CALCIUM 20 MG PO TABS
20.0000 mg | ORAL_TABLET | Freq: Every day | ORAL | 3 refills | Status: DC
Start: 2021-04-03 — End: 2021-09-02

## 2021-04-03 MED ORDER — FENOFIBRATE 145 MG PO TABS: ORAL_TABLET | ORAL | 1 refills | Status: DC

## 2021-04-03 MED ORDER — LISINOPRIL-HYDROCHLOROTHIAZIDE 20-12.5 MG PO TABS: 2.0000 | ORAL_TABLET | Freq: Every day | ORAL | 1 refills | Status: DC

## 2021-04-03 MED ORDER — AMLODIPINE BESYLATE 10 MG PO TABS: 10.0000 mg | ORAL_TABLET | Freq: Every day | ORAL | 1 refills | Status: DC

## 2021-04-03 NOTE — Progress Notes (Signed)
Surgery on 04/10/21

## 2021-04-03 NOTE — Telephone Encounter (Signed)
Preoperative risk assessment form completed and returned to [facility] work basket. Please fax to his orthopedic team along with office visit note from today.

## 2021-04-03 NOTE — Progress Notes (Signed)
This visit occurred during the SARS-CoV-2 public health emergency.  Safety protocols were in place, including screening questions prior to the visit, additional usage of staff PPE, and extensive cleaning of exam room while observing appropriate contact time as indicated for disinfecting solutions.    Jacob Aguirre , 1965/03/27, 56 y.o., male MRN: [id number] Patient Care Team    Relationship Specialty Notifications Start End  Ma Hillock, DO PCP - General Family Medicine  07/12/17   Nahser, Wonda Cheng, MD  Cardiology  08/27/11   Deneise Lever, MD Consulting Physician Pulmonary Disease  07/12/17   Lavonna Monarch, MD Consulting Physician Dermatology  02/14/20   Lavena Bullion, DO Consulting Physician Gastroenterology  10/14/20     Chief Complaint  Patient presents with   Pre-op Exam    Pt is not fasting     Subjective: Pt presents for an OV for operative "clearance." Procedure:Right Knee Arthroplasty  Indication: pain- Arthritis.  Anesthesia:Spinal  Surgery type risk:   - Intermediate risk= orthopedics Prior anesthesia complications: pt denies. Has had multiple surgeries without complication.  Family history of prior anesthesia complications:Pt denies  Cardiac: No prior history per patient.    - METs: > 4 Pulmonary: sleep apnea - wears his CPAP  Endocrine: Diabetes Obesity:Body mass index is 37.66 kg/m.' Chronic kidney disease: normal kidney fx Chronic med that needs to be continued: BP meds.  Anticoagulation: n/a  Labs: CBC, BMP > reviewed prior collections. Orthopedics repeated pre-op- do not have those records.    Depression screen [facility] 2/9 10/14/2020 04/29/2020 01/25/2019 07/12/2017  Decreased Interest 0 0 0 0  Down, Depressed, Hopeless 0 0 0 0  PHQ - 2 Score 0 0 0 0    Allergies  Allergen Reactions   Erythromycin    Niacin And Related     Hives   Penicillins     Causes Hives   Social History   Social History Narrative   Married.   B.S. Firefighter for  the airport.    Nonsmoker.   Drinks caffeine.    Smoke alarm in the home. Wears her seatbelt.    Owns firearms.    Feels safe in his relationships.    Past Medical History:  Diagnosis Date   Gout    Right large toe   Hematuria    pt reports chronic.    HTN (hypertension)    Dr. Acie Fredrickson   Hypercholesterolemia    Meniscal injury 1990   OSA (obstructive sleep apnea)    wears CPAP; Dr. Annamaria Boots   Sleep apnea    cpap   Tinea corporis    Past Surgical History:  Procedure Laterality Date   FINGER SURGERY     KNEE ARTHROSCOPY W/ MENISCAL REPAIR Right 1990   KNEE SURGERY     Broken Knee Cap   pilonidal cyst resection     POPLITEAL SYNOVIAL CYST EXCISION Right    3rd grade   Family History  Problem Relation Age of Onset   Heart murmur Father    Hearing loss Father    Colon polyps Father    Stroke Father    Hypertension Mother    COPD Mother    Testicular cancer Brother 17       And prostate cancer   Colon cancer Neg Hx    Esophageal cancer Neg Hx    Rectal cancer Neg Hx    Stomach cancer Neg Hx    Allergies as of 04/03/2021  Reactions   Erythromycin    Niacin And Related    Hives   Penicillins    Causes Hives        Medication List        Accurate as of April 03, 2021 11:31 AM. If you have any questions, ask your nurse or doctor.          alclomethasone 0.05 % cream Commonly known as: ACLOVATE APPLY TO AFFECTED AREA EVERY DAY TO TWICE A DAY   amLODipine 10 MG tablet Commonly known as: NORVASC Take 1 tablet (10 mg total) by mouth daily.   atorvastatin 20 MG tablet Commonly known as: LIPITOR Take 1 tablet (20 mg total) by mouth daily.   clobetasol cream 0.05 % Commonly known as: TEMOVATE APPLY TO AFFECTED AREA 1-2 TIMES DAILY not for face or genitals   fenofibrate 145 MG tablet Commonly known as: TRICOR TAKE 1 TAB DAILY WITH MEAL   fluticasone 50 MCG/ACT nasal spray Commonly known as: FLONASE Place 2 sprays into both nostrils daily.    lisinopril-hydrochlorothiazide 20-12.5 MG tablet Commonly known as: ZESTORETIC TAKE 2 TABLETS BY MOUTH DAILY   meloxicam 15 MG tablet Commonly known as: MOBIC Take 1 tablet (15 mg total) by mouth daily.        All past medical history, surgical history, allergies, family history, immunizations andmedications were updated in the EMR today and reviewed under the history and medication portions of their EMR.     ROS Negative, with the exception of above mentioned in HPI   Objective:  BP 114/75    Pulse 74    Temp 97.9 F (36.6 C)    Wt 270 lb (122.5 kg)    SpO2 95%    BMI 37.66 kg/m  Body mass index is 37.66 kg/m. Physical Exam Vitals and nursing note reviewed.  Constitutional:      General: He is not in acute distress.    Appearance: Normal appearance. He is not ill-appearing, toxic-appearing or diaphoretic.  HENT:     Head: Normocephalic and atraumatic.     Mouth/Throat:     Mouth: Mucous membranes are moist.  Eyes:     General: No scleral icterus.       Right eye: No discharge.        Left eye: No discharge.     Extraocular Movements: Extraocular movements intact.     Pupils: Pupils are equal, round, and reactive to light.  Cardiovascular:     Rate and Rhythm: Normal rate and regular rhythm.  Pulmonary:     Effort: Pulmonary effort is normal. No respiratory distress.     Breath sounds: Normal breath sounds. No wheezing, rhonchi or rales.  Musculoskeletal:     Cervical back: Neck supple.     Right lower leg: No edema.     Left lower leg: No edema.  Lymphadenopathy:     Cervical: No cervical adenopathy.  Skin:    General: Skin is warm and dry.     Coloration: Skin is not jaundiced or pale.     Findings: No rash.  Neurological:     Mental Status: He is alert and oriented to person, place, and time. Mental status is at baseline.  Psychiatric:        Mood and Affect: Mood normal.        Behavior: Behavior normal.        Thought Content: Thought content normal.         Judgment: Judgment normal.   No  results found. No results found. No results found for this or any previous visit (from the past 24 hour(s)).  Assessment/Plan: Jacob Aguirre is a 55 y.o. male present for OV for  To the best of my knowledge and per patients reported PMH, there is not a medical contraindication for undergoing elective surgery. He is of low/average risk for medical/cardiac complications. Patient understands the purpose of preoperative visit is to attempt to minimize surgical complications and communicate to surgical team chronic conditions and management. No patient is free of risk when undergoing a procedure. The decision about whether to proceed with the operation belongs to the surgeon and the patient. Patient's chronic conditions have been stable.  He is not anemic.  He is not a diabetic.  His BMI is in the obese range, but less than 40 per requirement.  Primary hypertension/hyperlipidemia/obesity Stable. Continue blood pressure regimen day of surgery OSA (obstructive sleep apnea) Patient does wear CPAP.  May require monitoring with anesthesia. Thrombocytopenia ([place]) Has been stable at about 140k.  Should not cause a bleeding issue. Arthritis of knee - stopped  mobic few months ago.   Reviewed expectations re: course of current medical issues. Discussed self-management of symptoms. Outlined signs and symptoms indicating need for more acute intervention. Patient verbalized understanding and all questions were answered. Patient received an After-Visit Summary.    No orders of the defined types were placed in this encounter.  No orders of the defined types were placed in this encounter.  Referral Orders  No referral(s) requested today     Note is dictated utilizing voice recognition software. Although note has been proof read prior to signing, occasional typographical errors still can be missed. If any questions arise, please do not hesitate to call for  verification.   electronically signed by:  Howard Pouch, DO  [place]

## 2021-04-03 NOTE — Telephone Encounter (Signed)
Form faxed with note

## 2021-04-03 NOTE — Patient Instructions (Signed)
°  Great to see you today.  I have refilled the medication(s) we provide.   Good luck on your surgery.   Make an appt here in in about 1-2 months (once your knee is better) to discuss weight  and prediabetes.

## 2021-04-06 DIAGNOSIS — H90A31 Mixed conductive and sensorineural hearing loss, unilateral, right ear with restricted hearing on the contralateral side: Secondary | ICD-10-CM | POA: Insufficient documentation

## 2021-04-14 ENCOUNTER — Ambulatory Visit: Payer: 59 | Admitting: Family Medicine

## 2021-04-19 ENCOUNTER — Encounter: Payer: Self-pay | Admitting: Dermatology

## 2021-04-19 NOTE — Progress Notes (Signed)
° °  Follow-Up Visit   Subjective  Jacob Aguirre is a 56 y.o. male who presents for the following: Annual Exam (Patient here for lesions on legs are spreading x 3 months. Ran out clobetasol needs refill.  ).  Psoriasis, flares when he stops the topicals Location:  Duration:  Quality:  Associated Signs/Symptoms: Modifying Factors:  Severity:  Timing: Context:   Objective  Well appearing patient in no apparent distress; mood and affect are within normal limits. Left Lower Leg - Anterior, Right Lower Leg - Anterior Buttocks, lower legs and  groin area: Small to medium size plaques    All skin waist up examined.  Plus legs   Assessment & Plan    Psoriasis vulgaris Left Lower Leg - Anterior; Right Lower Leg - Anterior  Expresses good understanding about not using potent cortisones like clobetasol and sensitive areas of the face or body folds.  Also has been Discussing newer topical and systemic therapies and outpatient to continue his topicals.      I, Lavonna Monarch, MD, have reviewed all documentation for this visit.  The documentation on 04/19/21 for the exam, diagnosis, procedures, and orders are all accurate and complete.

## 2021-04-21 ENCOUNTER — Encounter: Payer: Self-pay | Admitting: Family Medicine

## 2021-04-21 ENCOUNTER — Other Ambulatory Visit: Payer: Self-pay

## 2021-04-21 ENCOUNTER — Ambulatory Visit: Payer: 59 | Admitting: Family Medicine

## 2021-04-21 VITALS — BP 115/73 | HR 69 | Temp 98.1°F | Ht 71.0 in | Wt 265.0 lb

## 2021-04-21 DIAGNOSIS — E782 Mixed hyperlipidemia: Secondary | ICD-10-CM | POA: Diagnosis not present

## 2021-04-21 DIAGNOSIS — Z23 Encounter for immunization: Secondary | ICD-10-CM

## 2021-04-21 DIAGNOSIS — I10 Essential (primary) hypertension: Secondary | ICD-10-CM

## 2021-04-21 DIAGNOSIS — R7989 Other specified abnormal findings of blood chemistry: Secondary | ICD-10-CM

## 2021-04-21 DIAGNOSIS — G4733 Obstructive sleep apnea (adult) (pediatric): Secondary | ICD-10-CM | POA: Diagnosis not present

## 2021-04-21 DIAGNOSIS — K760 Fatty (change of) liver, not elsewhere classified: Secondary | ICD-10-CM

## 2021-04-21 DIAGNOSIS — R946 Abnormal results of thyroid function studies: Secondary | ICD-10-CM

## 2021-04-21 DIAGNOSIS — D696 Thrombocytopenia, unspecified: Secondary | ICD-10-CM

## 2021-04-21 MED ORDER — TIRZEPATIDE 2.5 MG/0.5ML ~~LOC~~ SOAJ: 2.5000 mg | SUBCUTANEOUS | 0 refills | Status: DC

## 2021-04-21 NOTE — Patient Instructions (Signed)
START MOUNJARO injection once weekly. Make sure you stick to same day of the week for injections (DV:OUZHQUI).  Follow up 1 month- we will recheck a1c that day and increase dose of med.

## 2021-04-21 NOTE — Progress Notes (Signed)
This visit occurred during the SARS-CoV-2 public health emergency.  Safety protocols were in place, including screening questions prior to the visit, additional usage of staff PPE, and extensive cleaning of exam room while observing appropriate contact time as indicated for disinfecting solutions.    Patient ID: Jacob Aguirre, male  DOB: 10/29/1965, 56 y.o.   MRN: [id number] Patient Care Team    Relationship Specialty Notifications Start End  Ma Hillock, DO PCP - General Family Medicine  07/12/17   Nahser, Wonda Cheng, MD  Cardiology  08/27/11   Deneise Lever, MD Consulting Physician Pulmonary Disease  07/12/17   Lavonna Monarch, MD Consulting Physician Dermatology  02/14/20   Lavena Bullion, DO Consulting Physician Gastroenterology  10/14/20     Chief Complaint  Patient presents with   Hypertension    [facility]; pt is not fasting    Subjective: Jacob Aguirre is a 56 y.o. male present for [facility] All past medical history, surgical history, allergies, family history, immunizations, medications and social history were updated in the electronic medical record today. All recent labs, ED visits and hospitalizations within the last year were reviewed.  Hypertension/hyperlipidemia/obesity: Pt reports compliance with lisinopril 40 mg , hctz 25 mg daily and amlodipine 10 mg qd. Patient denies chest pain, shortness of breath, dizziness or lower extremity edema.  Patient has Lipitor and is tolerating. RF: Hypertension, hypertriglyceridemia, obesity.  Family history of stroke in his father.   Hepatic steatosis/elevated liver enzymes/thrombocytopenia:  He is tolerating low dose Lipitor. He is established with heme for possible mild ITP. PLTs had improved.  Prior note: Elevated liver enzymes reported by his health at work labs.  After receiving his health at work papers we discontinued his Voltaren and Lipitor.  Timeline suggest Voltaren may have increased his LFTs in 2019, however they had been  mildly elevated even up into that point.  He does report he is taking omega-3's and increasing his fiber now for his cholesterol.  His repeat lipid panel last month looked great with no statin.  His father recently had a stroke and patient will benefit from adding back on cholesterol medicine for CV protection when able.  He states he has started a Mediterranean diet, he is no longer eating red meat.  He has started the Mobic in place of the Voltaren and feels it is working rather well to even better. His platelets had decreased 1 month ago and were commented on being enlarged.  This is a new finding for him.  Elevated a1c/weight loss counseling: Patient reports his A1c was 6.2 when collected for his preop surgical visit.  He has been watching his diet and following a low glycemic index diet and is lost 5 pounds in less than 3 weeks.  Unfortunately his surgery was postponed.  Anesthesia prefers to wait greater than 7-8 weeks after COVID before patient is scheduled for elective surgery.   Depression screen [facility] 2/9 04/21/2021 10/14/2020 04/29/2020 01/25/2019 07/12/2017  Decreased Interest 0 0 0 0 0  Down, Depressed, Hopeless 0 0 0 0 0  PHQ - 2 Score 0 0 0 0 0   No flowsheet data found.       Fall Risk  07/12/2017  Falls in the past year? No      Immunization History  Administered Date(s) Administered   Influenza Whole 12/14/2011   Influenza,inj,Quad PF,6+ Mos 01/12/2013, 12/15/2018, 04/29/2020   Influenza-Unspecified 12/13/2013, 11/29/2017   Td 08/05/2003   Tdap 09/15/2010, 10/14/2020   Zoster  Recombinat (Shingrix) 10/14/2020, 04/21/2021     Past Medical History:  Diagnosis Date   Gout    Right large toe   Hematuria    pt reports chronic.    HTN (hypertension)    Dr. Acie Fredrickson   Hypercholesterolemia    Meniscal injury 1990   OSA (obstructive sleep apnea)    wears CPAP; Dr. Annamaria Boots   Sleep apnea    cpap   Tinea corporis    Allergies  Allergen Reactions   Erythromycin    Niacin And  Related     Hives   Penicillins     Causes Hives   Past Surgical History:  Procedure Laterality Date   FINGER SURGERY     KNEE ARTHROSCOPY W/ MENISCAL REPAIR Right 1990   KNEE SURGERY     Broken Knee Cap   pilonidal cyst resection     POPLITEAL SYNOVIAL CYST EXCISION Right    3rd grade   Family History  Problem Relation Age of Onset   Heart murmur Father    Hearing loss Father    Colon polyps Father    Stroke Father    Hypertension Mother    COPD Mother    Testicular cancer Brother 80       And prostate cancer   Colon cancer Neg Hx    Esophageal cancer Neg Hx    Rectal cancer Neg Hx    Stomach cancer Neg Hx    Social History   Social History Narrative   Married.   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    Nonsmoker.   Drinks caffeine.    Smoke alarm in the home. Wears her seatbelt.    Owns firearms.    Feels safe in his relationships.     Allergies as of 04/21/2021       Reactions   Erythromycin    Niacin And Related    Hives   Penicillins    Causes Hives        Medication List        Accurate as of April 21, 2021 11:59 PM. If you have any questions, ask your nurse or doctor.          alclomethasone 0.05 % cream Commonly known as: ACLOVATE APPLY TO AFFECTED AREA EVERY DAY TO TWICE A DAY   amLODipine 10 MG tablet Commonly known as: NORVASC Take 1 tablet (10 mg total) by mouth daily.   atorvastatin 20 MG tablet Commonly known as: LIPITOR Take 1 tablet (20 mg total) by mouth daily.   clobetasol cream 0.05 % Commonly known as: TEMOVATE APPLY TO AFFECTED AREA 1-2 TIMES DAILY not for face or genitals   fenofibrate 145 MG tablet Commonly known as: TRICOR TAKE 1 TAB DAILY WITH MEAL   fluticasone 50 MCG/ACT nasal spray Commonly known as: FLONASE Place 2 sprays into both nostrils daily.   lisinopril-hydrochlorothiazide 20-12.5 MG tablet Commonly known as: ZESTORETIC Take 2 tablets by mouth daily.   tirzepatide 2.5 MG/0.5ML Pen Commonly  known as: MOUNJARO Inject 2.5 mg into the skin once a week. Started by: Howard Pouch, DO       All past medical history, surgical history, allergies, family history, immunizations andmedications were updated in the EMR today and reviewed under the history and medication portions of their EMR.     No results found for this or any previous visit (from the past 2160 hour(s)).   ROS 14 pt review of systems performed and negative (unless mentioned in an HPI)  Objective:  BP 115/73    Pulse 69    Temp 98.1 F (36.7 C) (Oral)    Ht 5\' 11"  (1.803 m)    Wt 265 lb (120.2 kg)    SpO2 94%    BMI 36.96 kg/m  Physical Exam Constitutional:      General: He is not in acute distress.    Appearance: Normal appearance. He is not ill-appearing, toxic-appearing or diaphoretic.  HENT:     Head: Normocephalic and atraumatic.     Right Ear: Tympanic membrane, ear canal and external ear normal. There is no impacted cerumen.     Left Ear: Tympanic membrane, ear canal and external ear normal. There is no impacted cerumen.     Nose: Nose normal. No congestion or rhinorrhea.     Mouth/Throat:     Mouth: Mucous membranes are moist.     Pharynx: Oropharynx is clear. No oropharyngeal exudate or posterior oropharyngeal erythema.  Eyes:     General: No scleral icterus.       Right eye: No discharge.        Left eye: No discharge.     Extraocular Movements: Extraocular movements intact.     Pupils: Pupils are equal, round, and reactive to light.  Cardiovascular:     Rate and Rhythm: Normal rate and regular rhythm.     Pulses: Normal pulses.     Heart sounds: Normal heart sounds. No murmur heard.   No friction rub. No gallop.  Pulmonary:     Effort: Pulmonary effort is normal. No respiratory distress.     Breath sounds: Normal breath sounds. No stridor. No wheezing, rhonchi or rales.  Chest:     Chest wall: No tenderness.  Abdominal:     General: Abdomen is flat. Bowel sounds are normal. There is no  distension.     Palpations: Abdomen is soft. There is no mass.     Tenderness: There is no abdominal tenderness. There is no right CVA tenderness, left CVA tenderness, guarding or rebound.     Hernia: No hernia is present.  Musculoskeletal:        General: No swelling or tenderness. Normal range of motion.     Cervical back: Normal range of motion and neck supple.     Right lower leg: No edema.     Left lower leg: No edema.  Lymphadenopathy:     Cervical: No cervical adenopathy.  Skin:    General: Skin is warm and dry.     Coloration: Skin is not jaundiced.     Findings: No bruising, lesion or rash.  Neurological:     General: No focal deficit present.     Mental Status: He is alert and oriented to person, place, and time. Mental status is at baseline.     Cranial Nerves: No cranial nerve deficit.     Sensory: No sensory deficit.     Motor: No weakness.     Coordination: Coordination normal.     Gait: Gait normal.     Deep Tendon Reflexes: Reflexes normal.  Psychiatric:        Mood and Affect: Mood normal.        Behavior: Behavior normal.        Thought Content: Thought content normal.        Judgment: Judgment normal.   No results found.  Assessment/plan: Jacob Aguirre is a 56 y.o. male present for [facility] Mixed hyperlipidemia/Essential hypertension/morbid obesity/elevated a1c Stable continue lisinopril 20 mg - hctz  12.5 mg (2 TABS) daily. Continue Amlodipine 10 mg qd> may be causing some LE edema. Continue Lipitor 20 mg nightly for CV protection with family history of stroke in his father - continue fish oil as long as plt remain stable  -Continue higher fiber diet/Metamucil - Fhx of recent stroke in his father. Discussed low glycemic index and calorie counting today.  He is already started this and is doing a good job already lost 5 pounds. Discussed Mounjaro start.  This was called in for him today.  He will schedule nurse visit for proper instruction on injection if  desired. Follow-up 5.5 months  OSA: Compliant   Arthritis/knee pain:  Unfortunately his surgery was postponed until March.  Thrombocytopenia: Stable.  Borderline thrombocytopenia.  No concerns.  Need for Shingrix vaccine: Shingrix No. 2 completed today.  Return in about 4 weeks (around 05/19/2021) for [facility] (30 min).  Orders Placed This Encounter  Procedures   Varicella-zoster vaccine IM   Orders Placed This Encounter  Procedures   Varicella-zoster vaccine IM   Meds ordered this encounter  Medications   tirzepatide (MOUNJARO) 2.5 MG/0.5ML Pen    Sig: Inject 2.5 mg into the skin once a week.    Dispense:  2 mL    Refill:  0   Referral Orders  No referral(s) requested today     Note is dictated utilizing voice recognition software. Although note has been proof read prior to signing, occasional typographical errors still can be missed. If any questions arise, please do not hesitate to call for verification.  Electronically signed by: Howard Pouch, DO [place]

## 2021-04-27 ENCOUNTER — Encounter: Payer: Self-pay | Admitting: Family Medicine

## 2021-05-13 HISTORY — PX: REPLACEMENT TOTAL KNEE: SUR1224

## 2021-05-18 ENCOUNTER — Encounter: Payer: Self-pay | Admitting: Family Medicine

## 2021-05-18 ENCOUNTER — Ambulatory Visit: Payer: 59 | Admitting: Family Medicine

## 2021-05-18 ENCOUNTER — Other Ambulatory Visit: Payer: Self-pay

## 2021-05-18 VITALS — BP 109/70 | HR 70 | Temp 98.1°F | Ht 71.0 in | Wt 265.0 lb

## 2021-05-18 DIAGNOSIS — R7303 Prediabetes: Secondary | ICD-10-CM

## 2021-05-18 LAB — POCT GLYCOSYLATED HEMOGLOBIN (HGB A1C)
HbA1c POC (<> result, manual entry): 5.5 % (ref 4.0–5.6)
HbA1c, POC (controlled diabetic range): 5.5 % (ref 0.0–7.0)
HbA1c, POC (prediabetic range): 5.5 % — AB (ref 5.7–6.4)
Hemoglobin A1C: 5.5 % (ref 4.0–5.6)

## 2021-05-18 MED ORDER — TIRZEPATIDE 2.5 MG/0.5ML ~~LOC~~ SOAJ: 2.5000 mg | SUBCUTANEOUS | 11 refills | Status: DC

## 2021-05-18 NOTE — Progress Notes (Signed)
This visit occurred during the SARS-CoV-2 public health emergency.  Safety protocols were in place, including screening questions prior to the visit, additional usage of staff PPE, and extensive cleaning of exam room while observing appropriate contact time as indicated for disinfecting solutions.    Patient ID: Jacob Aguirre, male  DOB: 11-26-1965, 56 y.o.   MRN: [id number] Patient Care Team    Relationship Specialty Notifications Start End  Ma Hillock, DO PCP - General Family Medicine  07/12/17   Nahser, Wonda Cheng, MD  Cardiology  08/27/11   Deneise Lever, MD Consulting Physician Pulmonary Disease  07/12/17   Lavonna Monarch, MD Consulting Physician Dermatology  02/14/20   Lavena Bullion, DO Consulting Physician Gastroenterology  10/14/20     Chief Complaint  Patient presents with   Obesity    No food long   Prediabetes    Pt is not fasting    Subjective: Jacob Aguirre is a 56 y.o. male present for [facility] All past medical history, surgical history, allergies, family history, immunizations, medications and social history were updated in the electronic medical record today. All recent labs, ED visits and hospitalizations within the last year were reviewed.  Elevated a1c/weight loss counseling: Patient reports his A1c was 6.2 when collected for his preop surgical visit.  He has been watching his diet and following a low glycemic index diet and is lost 5 pounds.  He was seen 4 weeks ago and Jacob Aguirre was prescribed for him.  Unfortunately he has not started this secondary to cost.    Documentation only: Hypertension/hyperlipidemia/obesity: Pt reports compliance with lisinopril 40 mg , hctz 25 mg daily and amlodipine 10 mg qd. Patient denies chest pain, shortness of breath, dizziness or lower extremity edema.  Patient has Lipitor and is tolerating. RF: Hypertension, hypertriglyceridemia, obesity.  Family history of stroke in his father.   Hepatic steatosis/elevated liver  enzymes/thrombocytopenia:  He is tolerating low dose Lipitor. He is established with heme for possible mild ITP. PLTs had improved.  Prior note: Elevated liver enzymes reported by his health at work labs.  After receiving his health at work papers we discontinued his Voltaren and Lipitor.  Timeline suggest Voltaren may have increased his LFTs in 2019, however they had been mildly elevated even up into that point.  He does report he is taking omega-3's and increasing his fiber now for his cholesterol.  His repeat lipid panel last month looked great with no statin.  His father recently had a stroke and patient will benefit from adding back on cholesterol medicine for CV protection when able.  He states he has started a Mediterranean diet, he is no longer eating red meat.  He has started the Mobic in place of the Voltaren and feels it is working rather well to even better. His platelets had decreased 1 month ago and were commented on being enlarged.  This is a new finding for him.  Depression screen [facility] 2/9 04/21/2021 10/14/2020 04/29/2020 01/25/2019 07/12/2017  Decreased Interest 0 0 0 0 0  Down, Depressed, Hopeless 0 0 0 0 0  PHQ - 2 Score 0 0 0 0 0   No flowsheet data found.      Fall Risk  07/12/2017  Falls in the past year? No    Immunization History  Administered Date(s) Administered   Influenza Whole 12/14/2011   Influenza,inj,Quad PF,6+ Mos 01/12/2013, 12/15/2018, 04/29/2020   Influenza-Unspecified 12/13/2013, 11/29/2017   Td 08/05/2003   Tdap 09/15/2010, 10/14/2020  Zoster Recombinat (Shingrix) 10/14/2020, 04/21/2021     Past Medical History:  Diagnosis Date   Gout    Right large toe   Hematuria    pt reports chronic.    HTN (hypertension)    Dr. Acie Fredrickson   Hypercholesterolemia    Meniscal injury 1990   OSA (obstructive sleep apnea)    wears CPAP; Dr. Annamaria Boots   Sleep apnea    cpap   Tinea corporis    Allergies  Allergen Reactions   Erythromycin    Niacin  And Related     Hives   Penicillins     Causes Hives   Past Surgical History:  Procedure Laterality Date   FINGER SURGERY     KNEE ARTHROSCOPY W/ MENISCAL REPAIR Right 1990   KNEE SURGERY     Broken Knee Cap   pilonidal cyst resection     POPLITEAL SYNOVIAL CYST EXCISION Right    3rd grade   Family History  Problem Relation Age of Onset   Heart murmur Father    Hearing loss Father    Colon polyps Father    Stroke Father    Hypertension Mother    COPD Mother    Testicular cancer Brother 52       And prostate cancer   Colon cancer Neg Hx    Esophageal cancer Neg Hx    Rectal cancer Neg Hx    Stomach cancer Neg Hx    Social History   Social History Narrative   Married.   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    Nonsmoker.   Drinks caffeine.    Smoke alarm in the home. Wears her seatbelt.    Owns firearms.    Feels safe in his relationships.     Allergies as of 05/18/2021       Reactions   Erythromycin    Niacin And Related    Hives   Penicillins    Causes Hives        Medication List        Accurate as of May 18, 2021 12:58 PM. If you have any questions, ask your nurse or doctor.          alclomethasone 0.05 % cream Commonly known as: ACLOVATE APPLY TO AFFECTED AREA EVERY DAY TO TWICE A DAY   amLODipine 10 MG tablet Commonly known as: NORVASC Take 1 tablet (10 mg total) by mouth daily.   atorvastatin 20 MG tablet Commonly known as: LIPITOR Take 1 tablet (20 mg total) by mouth daily.   clobetasol cream 0.05 % Commonly known as: TEMOVATE APPLY TO AFFECTED AREA 1-2 TIMES DAILY not for face or genitals   fenofibrate 145 MG tablet Commonly known as: TRICOR TAKE 1 TAB DAILY WITH MEAL   fluticasone 50 MCG/ACT nasal spray Commonly known as: FLONASE Place 2 sprays into both nostrils daily.   lisinopril-hydrochlorothiazide 20-12.5 MG tablet Commonly known as: ZESTORETIC Take 2 tablets by mouth daily.   tirzepatide 2.5 MG/0.5ML  Pen Commonly known as: MOUNJARO Inject 2.5 mg into the skin once a week.       All past medical history, surgical history, allergies, family history, immunizations andmedications were updated in the EMR today and reviewed under the history and medication portions of their EMR.     Recent Results (from the past 2160 hour(s))  POCT HgB A1C     Status: Abnormal   Collection Time: 05/18/21  9:28 AM  Result Value Ref Range   Hemoglobin A1C 5.5 4.0 -  5.6 %   HbA1c POC (<> result, manual entry) 5.5 4.0 - 5.6 %   HbA1c, POC (prediabetic range) 5.5 (A) 5.7 - 6.4 %   HbA1c, POC (controlled diabetic range) 5.5 0.0 - 7.0 %     ROS 14 pt review of systems performed and negative (unless mentioned in an HPI) Objective: BP 109/70    Pulse 70    Temp 98.1 F (36.7 C) (Oral)    Ht '5\' 11"'$  (1.803 m)    Wt 265 lb (120.2 kg)    SpO2 95%    BMI 36.96 kg/m  Physical Exam Constitutional:      General: He is not in acute distress.    Appearance: Normal appearance. He is not ill-appearing, toxic-appearing or diaphoretic.  HENT:     Head: Normocephalic and atraumatic.     Right Ear: Tympanic membrane, ear canal and external ear normal. There is no impacted cerumen.     Left Ear: Tympanic membrane, ear canal and external ear normal. There is no impacted cerumen.     Nose: Nose normal. No congestion or rhinorrhea.     Mouth/Throat:     Mouth: Mucous membranes are moist.     Pharynx: Oropharynx is clear. No oropharyngeal exudate or posterior oropharyngeal erythema.  Eyes:     General: No scleral icterus.       Right eye: No discharge.        Left eye: No discharge.     Extraocular Movements: Extraocular movements intact.     Pupils: Pupils are equal, round, and reactive to light.  Cardiovascular:     Rate and Rhythm: Normal rate and regular rhythm.     Pulses: Normal pulses.     Heart sounds: Normal heart sounds. No murmur heard.   No friction rub. No gallop.  Pulmonary:     Effort: Pulmonary  effort is normal. No respiratory distress.     Breath sounds: Normal breath sounds. No stridor. No wheezing, rhonchi or rales.  Chest:     Chest wall: No tenderness.  Abdominal:     General: Abdomen is flat. Bowel sounds are normal. There is no distension.     Palpations: Abdomen is soft. There is no mass.     Tenderness: There is no abdominal tenderness. There is no right CVA tenderness, left CVA tenderness, guarding or rebound.     Hernia: No hernia is present.  Musculoskeletal:        General: No swelling or tenderness. Normal range of motion.     Cervical back: Normal range of motion and neck supple.     Right lower leg: No edema.     Left lower leg: No edema.  Lymphadenopathy:     Cervical: No cervical adenopathy.  Skin:    General: Skin is warm and dry.     Coloration: Skin is not jaundiced.     Findings: No bruising, lesion or rash.  Neurological:     General: No focal deficit present.     Mental Status: He is alert and oriented to person, place, and time. Mental status is at baseline.     Cranial Nerves: No cranial nerve deficit.     Sensory: No sensory deficit.     Motor: No weakness.     Coordination: Coordination normal.     Gait: Gait normal.     Deep Tendon Reflexes: Reflexes normal.  Psychiatric:        Mood and Affect: Mood normal.  Behavior: Behavior normal.        Thought Content: Thought content normal.        Judgment: Judgment normal.   No results found.  Assessment/plan: DANARIUS MCCONATHY is a 56 y.o. male present for [facility] morbid obesity/diabetes: A1c 6.2> dietary changes> A1c today is 5.5.  This is excellent. Discussed benefits of Mounjaro to help initiate weight loss and he will look into completing the discount card online and consider starting.  He understands he can make a nurse visit when he picks up the medication for instruction on technique. If he starts Mounjaro follow-up in 2-3 months for follow-up. If he does not start Mounjaro we will  follow-up with him in 5.5 months with A1c recheck at that time along with his other chronic conditions.  Documentation only: Mixed hyperlipidemia/Essential hypertension Stable continue lisinopril 20 mg - hctz 12.5 mg (2 TABS) daily. Continue Amlodipine 10 mg qd> may be causing some LE edema. Continue Lipitor 20 mg nightly for CV protection with family history of stroke in his father - continue fish oil as long as plt remain stable  -Continue higher fiber diet/Metamucil - Fhx of recent stroke in his father. Discussed low glycemic index and calorie counting today.  He is already started this and is doing a good job already lost 5 pounds. Discussed Mounjaro start.  This was called in for him today.  He will schedule nurse visit for proper instruction on injection if desired. Follow-up 5.5 months  OSA: Compliant   Arthritis/knee pain:  Unfortunately his surgery was postponed until March.  Thrombocytopenia: Stable.  Borderline thrombocytopenia.  No concerns.   Return in about 15 weeks (around 08/31/2021) for [facility] (30 min).  Orders Placed This Encounter  Procedures   POCT HgB A1C   Orders Placed This Encounter  Procedures   POCT HgB A1C   Meds ordered this encounter  Medications   tirzepatide (MOUNJARO) 2.5 MG/0.5ML Pen    Sig: Inject 2.5 mg into the skin once a week.    Dispense:  2 mL    Refill:  11   Referral Orders  No referral(s) requested today     Note is dictated utilizing voice recognition software. Although note has been proof read prior to signing, occasional typographical errors still can be missed. If any questions arise, please do not hesitate to call for verification.  Electronically signed by: Howard Pouch, DO [place]

## 2021-05-18 NOTE — Patient Instructions (Addendum)
Mid June for chronic conditions appt if you do not start Mounjaro. If you start Mounjaro follow up in 2-3 months.  ? ?Get online and see you if you qualify for the discount card on [facility] site.  ? ? ?Diabetes Mellitus and Exercise ?Exercising regularly is important for overall health, especially for people who have diabetes mellitus. Exercising is not only about losing weight. It has many other health benefits, such as increasing muscle strength and bone density and reducing body fat and stress. This leads to improved fitness, flexibility, and endurance, all of which result in better overall health. ?What are the benefits of exercise if I have diabetes? ?Exercise has many benefits for people with diabetes. They include: ?Helping to lower and control blood sugar (glucose). ?Helping the body to respond better to the hormone insulin by improving insulin sensitivity. ?Reducing how much insulin the body needs. ?Lowering the risk for heart disease by: ?Lowering "bad" cholesterol and triglyceride levels. ?Increasing "good" cholesterol levels. ?Lowering blood pressure. ?Lowering blood glucose levels. ?What is my activity plan? ?Your health care provider or certified diabetes educator can help you make a plan for the type and frequency of exercise that works for you. This is called your activity plan. Be sure to: ?Get at least 150 minutes of medium-intensity or high-intensity exercise each week. Exercises may include brisk walking, biking, or water aerobics. ?Do stretching and strengthening exercises, such as yoga or weight lifting, at least 2 times a week. ?Spread out your activity over at least 3 days of the week. ?Get some form of physical activity each day. ?Do not go more than 2 days in a row without some kind of physical activity. ?Avoid being inactive for more than 90 minutes at a time. Take frequent breaks to walk or stretch. ?Choose exercises or activities that you enjoy. Set realistic goals. ?Start slowly and  gradually increase your exercise intensity over time. ?How do I manage my diabetes during exercise? ?Monitor your blood glucose ?Check your blood glucose before and after exercising. If your blood glucose is: ?240 mg/dL (13.3 mmol/L) or higher before you exercise, check your urine for ketones. These are chemicals created by the liver. If you have ketones in your urine, do not exercise until your blood glucose returns to normal. ?100 mg/dL (5.6 mmol/L) or lower, eat a snack containing 15-20 grams of carbohydrate. Check your blood glucose 15 minutes after the snack to make sure that your glucose level is above 100 mg/dL (5.6 mmol/L) before you start your exercise. ?Know the symptoms of low blood glucose (hypoglycemia) and how to treat it. Your risk for hypoglycemia increases during and after exercise. ?Follow these tips and your health care provider's instructions ?Keep a carbohydrate snack that is fast-acting for use before, during, and after exercise to help prevent or treat hypoglycemia. ?Avoid injecting insulin into areas of the body that are going to be exercised. For example, avoid injecting insulin into: ?Your arms, when you are about to play tennis. ?Your legs, when you are about to go jogging. ?Keep records of your exercise habits. Doing this can help you and your health care provider adjust your diabetes management plan as needed. Write down: ?Food that you eat before and after you exercise. ?Blood glucose levels before and after you exercise. ?The type and amount of exercise you have done. ?Work with your health care provider when you start a new exercise or activity. He or she may need to: ?Make sure that the activity is safe for you. ?  Adjust your insulin, other medicines, and food that you eat. ?Drink plenty of water while you exercise. This prevents loss of water (dehydration) and problems caused by a lot of heat in the body (heat stroke). ?Where to find more information ?American Diabetes Association:  www.diabetes.org ?Summary ?Exercising regularly is important for overall health, especially for people who have diabetes mellitus. ?Exercising has many health benefits. It increases muscle strength and bone density and reduces body fat and stress. It also lowers and controls blood glucose. ?Your health care provider or certified diabetes educator can help you make an activity plan for the type and frequency of exercise that works for you. ?Work with your health care provider to make sure any new activity is safe for you. Also work with your health care provider to adjust your insulin, other medicines, and the food you eat. ?This information is not intended to replace advice given to you by your health care provider. Make sure you discuss any questions you have with your health care provider. ?Document Revised: 11/27/2018 Document Reviewed: 11/27/2018 ?Elsevier Patient Education ? 2022 [place]. ? ? ? ? ?

## 2021-05-21 ENCOUNTER — Telehealth: Payer: Self-pay | Admitting: Internal Medicine

## 2021-05-21 NOTE — Telephone Encounter (Signed)
Called and spoke with patient. He has been scheduled for an appt with CY for next month. He is needing to have his SD card downloaded. I advised him that he could stop by the office with the SD and one of [place] will get the download for him. Stated that he would stop by this afternoon.  ? ?Nothing further needed at time of call.  ?

## 2021-05-27 ENCOUNTER — Ambulatory Visit: Payer: 59

## 2021-05-27 ENCOUNTER — Other Ambulatory Visit: Payer: Self-pay

## 2021-05-30 ENCOUNTER — Emergency Department (HOSPITAL_BASED_OUTPATIENT_CLINIC_OR_DEPARTMENT_OTHER)
Admission: EM | Admit: 2021-05-30 | Discharge: 2021-05-30 | Disposition: A | Discharge status: Home / Self Care | Payer: 59 | Attending: Student | Admitting: Student

## 2021-05-30 ENCOUNTER — Other Ambulatory Visit: Payer: Self-pay

## 2021-05-30 ENCOUNTER — Encounter (HOSPITAL_BASED_OUTPATIENT_CLINIC_OR_DEPARTMENT_OTHER): Payer: Self-pay | Admitting: *Deleted

## 2021-05-30 DIAGNOSIS — G8918 Other acute postprocedural pain: Secondary | ICD-10-CM | POA: Insufficient documentation

## 2021-05-30 DIAGNOSIS — Z79899 Other long term (current) drug therapy: Secondary | ICD-10-CM | POA: Insufficient documentation

## 2021-05-30 DIAGNOSIS — M25561 Pain in right knee: Secondary | ICD-10-CM | POA: Insufficient documentation

## 2021-05-30 DIAGNOSIS — Z96651 Presence of right artificial knee joint: Secondary | ICD-10-CM | POA: Insufficient documentation

## 2021-05-30 DIAGNOSIS — M545 Low back pain, unspecified: Secondary | ICD-10-CM | POA: Diagnosis present

## 2021-05-30 DIAGNOSIS — I10 Essential (primary) hypertension: Secondary | ICD-10-CM | POA: Insufficient documentation

## 2021-05-30 MED ORDER — LIDOCAINE 5 % EX PTCH
1.0000 | MEDICATED_PATCH | Freq: Once | CUTANEOUS | Status: DC
  Administered 2021-05-30: 1 via TRANSDERMAL
  Filled 2021-05-30: qty 1

## 2021-05-30 MED ORDER — FENTANYL CITRATE PF 50 MCG/ML IJ SOSY
100.0000 ug | PREFILLED_SYRINGE | Freq: Once | INTRAMUSCULAR | Status: AC
Start: 2021-05-30 — End: 2021-05-30
  Administered 2021-05-30: 100 ug via INTRAMUSCULAR
  Filled 2021-05-30: qty 2

## 2021-05-30 NOTE — ED Notes (Signed)
Patient standing up with walker in room in room wearing no shirt, with visitor sitting on rollator outside doorway.  They were asked to kindly wait inside the room, as the door needs to remain closed.  Visitor responded "are there even any doctors here?"  She was notified that we have both a doctor and a mid-level provider here who would be in to see the patient as soon as they can. Patient stated he felt hot.  Ice packs given per request.  ?

## 2021-05-30 NOTE — Discharge Instructions (Addendum)
You were seen in the emergency department for back pain. ? ?I'm glad you had some improvement with the medicine given here. You can continue taking her prescribed medication.  I encourage following up with your surgeon, and make them aware that you needed to go to the emergency department due to pain. ?

## 2021-05-30 NOTE — ED Provider Notes (Signed)
?[facility] ?Provider Note ? ? ?CSN: [id number] ?Arrival date & time: 05/30/21  1957 ? ?  ? ?History ? ?Chief Complaint  ?Patient presents with  ? Knee Pain  ? Back Pain  ? ? ?CAVAN BEARDEN is a 56 y.o. male who presents the emergency department complaining of lower back pain and right knee pain.  Patient states that he mowed the grass on Thursday, and felt that he "tweaked" his back.  He had a total right knee replacement yesterday.  He believes that once the adrenaline and pain medication wore off after the surgery, he felt the pain in his back more.  He has been taking his prescribed medications without any relief.  He has been incredibly uncomfortable, cannot find a comfortable position. ? ? ?Knee Pain ?Associated symptoms: back pain   ?Associated symptoms: no fever   ?Back Pain ?Associated symptoms: no abdominal pain and no fever   ? ?  ? ?Home Medications ?Prior to Admission medications   ?Medication Sig Start Date End Date Taking? Authorizing Provider  ?alclomethasone (ACLOVATE) 0.05 % cream APPLY TO AFFECTED AREA EVERY DAY TO TWICE A DAY 03/30/21   Lavonna Monarch, MD  ?amLODipine (NORVASC) 10 MG tablet Take 1 tablet (10 mg total) by mouth daily. 04/03/21   Kuneff, Renee A, DO  ?atorvastatin (LIPITOR) 20 MG tablet Take 1 tablet (20 mg total) by mouth daily. 04/03/21   Kuneff, Renee A, DO  ?clobetasol cream (TEMOVATE) 0.05 % APPLY TO AFFECTED AREA 1-2 TIMES DAILY not for face or genitals 03/30/21   Lavonna Monarch, MD  ?fenofibrate (TRICOR) 145 MG tablet TAKE 1 TAB DAILY WITH MEAL 04/03/21   Kuneff, Renee A, DO  ?fluticasone (FLONASE) 50 MCG/ACT nasal spray Place 2 sprays into both nostrils daily. 09/23/20   Kuneff, Renee A, DO  ?lisinopril-hydrochlorothiazide (ZESTORETIC) 20-12.5 MG tablet Take 2 tablets by mouth daily. 04/03/21   Kuneff, Renee A, DO  ?tirzepatide (MOUNJARO) 2.5 MG/0.5ML Pen Inject 2.5 mg into the skin once a week. 05/18/21   Kuneff, Renee A, DO  ?   ? ?Allergies     ?Erythromycin, Niacin and related, and Penicillins   ? ?Review of Systems   ?Review of Systems  ?Constitutional:  Negative for fever.  ?Gastrointestinal:  Negative for abdominal pain.  ?Genitourinary:  Negative for difficulty urinating.  ?Musculoskeletal:  Positive for back pain.  ?     Right knee pain  ?All other systems reviewed and are negative. ? ?Physical Exam ?Updated Vital Signs ?BP 129/71 (BP Location: Left Arm)   Pulse 73   Temp 97.9 ?F (36.6 ?C) (Oral)   Resp 16   Ht '5\' 11"'$  (1.803 m)   Wt 118.8 kg   SpO2 95%   BMI 36.54 kg/m?  ?Physical Exam ?Vitals and nursing note reviewed.  ?Constitutional:   ?   Appearance: Normal appearance.  ?   Comments: Patient ambulating around exam room with walker  ?HENT:  ?   Head: Normocephalic and atraumatic.  ?Eyes:  ?   Conjunctiva/sclera: Conjunctivae normal.  ?Cardiovascular:  ?   Rate and Rhythm: Normal rate and regular rhythm.  ?   Pulses:     ?     Dorsalis pedis pulses are 1+ on the right side and 1+ on the left side.  ?Pulmonary:  ?   Effort: Pulmonary effort is normal. No respiratory distress.  ?   Breath sounds: Normal breath sounds.  ?Abdominal:  ?   General: There is no distension.  ?  Palpations: Abdomen is soft.  ?   Tenderness: There is no abdominal tenderness.  ?Musculoskeletal:  ?   Comments: No midline spinal tenderness, step-offs or crepitus.  Patient localizes pain to the right lumbar region, there is no focal tenderness to palpation. Sensation in tact in bilateral lower extremities.  ? ?R knee wrapped in ACE bandage. No increased warmth of the joint.   ?Skin: ?   General: Skin is warm and dry.  ?Neurological:  ?   General: No focal deficit present.  ?   Mental Status: He is alert.  ? ? ?ED Results / Procedures / Treatments   ?Labs ?(all labs ordered are listed, but only abnormal results are displayed) ?Labs Reviewed - No data to display ? ?EKG ?None ? ?Radiology ?No results found. ? ?Procedures ?Procedures  ? ? ?Medications Ordered in  ED ?Medications  ?lidocaine (LIDODERM) 5 % 1 patch (1 patch Transdermal Patch Applied 05/30/21 2155)  ?fentaNYL (SUBLIMAZE) injection 100 mcg (100 mcg Intramuscular Given 05/30/21 2154)  ? ? ?ED Course/ Medical Decision Making/ A&P ?  ?                        ?Medical Decision Making ?Risk ?Prescription drug management. ? ? ?This patient is a 56 year old male who presents to the ED for concern of back pain and postop right knee pain. No fevers, chills. No urinary symptoms.  ? ?Differential diagnoses prior to evaluation: ?Fracture (acute/chronic), muscle strain, cauda equina, spinal stenosis. DDD, ankylosing spondylitis, acute ligamentous injury, disk herniation, spondylolisthesis, Epidural compression syndrome, metastatic cancer, transverse myelitis, vertebral osteomyelitis, diskitis, kidney stone, pyelonephritis, AAA, Perforated ulcer, Retrocecal appendicitis, pancreatitis, bowel obstruction, retroperitoneal hemorrhage or mass, meningitis. ? ?Past Medical History / Co-morbidities / Social History: ?Hypertension, hyperlipidemia, OSA ? ?Physical Exam: ?Physical exam performed. The pertinent findings include: Patient appears very uncomfortable and is ambulating around the exam room with a walker.  He localizes his pain to the right lower lumbar region, no focal tenderness to palpation.  No midline spinal tenderness, step-offs or crepitus.  Right knee wrapped in Ace bandage, no increased warmth of the joint.  Good distal pulses and sensation intact bilaterally. ? ?Medications: ?Patient given lidoderm patch and analgesic.  On reevaluation patient states that his symptoms are improving. ?  ?Disposition: ?After consideration of the diagnostic results and the patients response to treatment, I feel that patient is not requiring admission or inpatient treatment for his symptoms.  I am reassured by the fact that the patient improved with some pain medication and Lidoderm patch while in the emergency department.  Think his  symptoms are likely related to a lumbar muscular strain and postoperative pain.  I recommended he continue to take his prescribed pain medication, and follow-up with his surgeon.  Discussed reasons to return to the emergency department, and the patient is agreeable to the plan.  ? ?Final Clinical Impression(s) / ED Diagnoses ?Final diagnoses:  ?Acute right-sided low back pain without sciatica  ?Post-op pain  ? ? ?Rx / DC Orders ?ED Discharge Orders   ? ? None  ? ?  ? ?Portions of this report may have been transcribed using voice recognition software. Every effort was made to ensure accuracy; however, inadvertent computerized transcription errors may be present. ? ?  ?Kateri Plummer, PA-C ?05/30/21 2257 ? ?  ?Teressa Lower, MD ?05/30/21 2355 ? ?

## 2021-05-30 NOTE — ED Notes (Signed)
Patient's wife came to desk and stated " Can you discharge Jacob Aguirre or have someone come discharge Jacob Aguirre". This Probation officer replied that I would let the nurse know. Wife then states " Well it will be another hour, she is gonna let the nurse know." ?

## 2021-05-30 NOTE — ED Notes (Signed)
Patient discharged to home.  All discharge instructions reviewed.  Patient verbalized understanding via teachback method.  VS WDL.  Respirations even and unlabored.  Ambulatory out of ED with walker.   ?

## 2021-05-30 NOTE — ED Triage Notes (Signed)
Pt reports he mowed the grass on Thursday and tweaked his back. Then had right knee surgery yesterday. States he has been taking prescribed medications without relief ?

## 2021-06-11 ENCOUNTER — Telehealth: Payer: Self-pay | Admitting: Family Medicine

## 2021-06-11 NOTE — Telephone Encounter (Signed)
Pt said his wife accidentally threw away his 2 pens. ? ?Pt injected hisself with the first 2 pens already. ?Pt wanting to know can he get 2 more pens? ? ?tirzepatide ?tirzepatide (MOUNJARO) 2.5 MG/0.5ML Pen ? ?Pt cell: [phone] ?

## 2021-06-11 NOTE — Telephone Encounter (Signed)
Informed pt that he will need to contact pharmacy regarding medication refill.  ?

## 2021-06-17 NOTE — Progress Notes (Signed)
? ?Subjective:  ? ? Patient ID: Jacob Aguirre, male    DOB: [birth year], 56 y.o.   MRN: [id number] ? ?HPI   ?M former smoker , fireman/ rotating shifts,  followed for OSA, complicated by HTN, Hepatic Steatosis, Thrombocytopenia,  Obesity, Hyperlipidemia,  ?NPSG 05/2011   AHI 56/ hr,    ? HST 2014  AHI 40/ hr ? ?---------------------------------------------------------------------------------------------------------- ? ? ?12/13/19- 54 yoM former smoker coming to re-establish. Last here in 2017, followed for OSA, complicated by HTN, Hepatic Steatosis, Thrombocytopenia,  Obesity, Hyperlipidemia,  ?NPSG 05/2011   AHI 56/ hr,    HST 2014  AHI 40/ hr ?CPAP auto 5-15/ [facility] ?Download- ?Covid vax- none ?Flu vax- Will get at work ?Body weight today- 260 lbs ?He has been using original machine compliantly and has slept better with it. He asks about easier ways to get supplies, since [facility] is an hour away from him. Asks about getting supplies from [facility] or on-line. ?Without CPAP he snores loudly and wakes up a lot. ?Being treated now for HTN and being evaluated for thrombocytopenia.  ?Has a So-Clean machine- discussed.   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  ?We had a long-talk about Covid infection and vaccine and I answered his questions, letting him know that I recommended vaccine.  ? ?06/22/21- 55 yoM former smoker followed for OSA, complicated by  HTN, Hepatic Steatosis, Thrombocytopenia,  Obesity, Hyperlipidemia,  ?CPAP auto 5-15/ [facility] ?Download-compliance 73%, AHI 1.7/ hr ?Covid vax- none ?Flu vax- had ?Body weight today- 256 lbs ?Download reviewed.  He explains frequent missed nights by saying knee surgery hurts his sleep.  Machine is also old enough to replace-discussed. ?We will let him try trazodone with discussion. ? ?ROS-see HPI   + = positive ?Constitutional:    weight loss, night sweats, fevers, chills, fatigue, lassitude. ?HEENT:     headaches, difficulty swallowing, tooth/dental problems, sore throat,  ?     sneezing, itching, ear ache, nasal congestion, post nasal drip, snoring ?CV:    chest pain, orthopnea, PND, swelling in lower extremities, anasarca,                                 ?  dizziness, palpitations ?Resp:   shortness of breath with exertion or at rest.   ?             productive cough,   non-productive cough, coughing up of blood.   ?           change in color of mucus.  wheezing.   ?Skin:    rash or lesions. ?GI:  No-   heartburn, indigestion, abdominal pain, nausea, vomiting, diarrhea,  ?               change in bowel habits, loss of appetite ?GU: dysuria, change in color of urine, no urgency or frequency.   flank pain. ?MS:   joint pain, stiffness, decreased range of motion, back pain. ?Neuro-     nothing unusual ?Psych:  change in mood or affect.  depression or anxiety.   memory loss. ? ?.   ?   ?Objective:  ?OBJ- Physical Exam ?General- Alert, Oriented, Affect-appropriate, Distress- none acute, + brawny ?Skin- rash-none, lesions- none, excoriation- none ?Lymphadenopathy- none ?Head- atraumatic ?           Eyes- Gross vision intact,  PERRLA, conjunctivae and secretions clear ?           Ears- Hearing, canals-normal ?           Nose- Clear, no-Septal dev, mucus, polyps, erosion, perforation  ?           Throat- Mallampati III , mucosa clear , drainage- none, tonsils- atrophic, + teeth ?Neck- flexible , trachea midline, no stridor , thyroid nl, carotid no bruit ?Chest - symmetrical excursion , unlabored ?          Heart/CV- RRR , no murmur , no gallop  , no rub, nl s1 s2 ?                          - JVD- none , edema- none, stasis changes- none, varices- none ?          Lung- clear to P&A, wheeze- none, cough- none , dullness-none, rub- none ?          Chest wall-  ?Abd-  ?Br/ Gen/ Rectal- Not done, not indicated ?Extrem- R TKR sore ?Neuro- grossly intact to observation ? ? ?Assessment & Plan:  ? ?

## 2021-06-21 ENCOUNTER — Encounter: Payer: Self-pay | Admitting: Internal Medicine

## 2021-06-22 ENCOUNTER — Encounter: Payer: Self-pay | Admitting: Internal Medicine

## 2021-06-22 ENCOUNTER — Ambulatory Visit (INDEPENDENT_AMBULATORY_CARE_PROVIDER_SITE_OTHER): Payer: 59 | Admitting: Internal Medicine

## 2021-06-22 VITALS — BP 130/80 | HR 67 | Temp 97.9°F | Ht 71.0 in | Wt 256.8 lb

## 2021-06-22 DIAGNOSIS — M1710 Unilateral primary osteoarthritis, unspecified knee: Secondary | ICD-10-CM

## 2021-06-22 DIAGNOSIS — G4733 Obstructive sleep apnea (adult) (pediatric): Secondary | ICD-10-CM

## 2021-06-22 MED ORDER — TRAZODONE HCL 50 MG PO TABS: ORAL_TABLET | ORAL | 2 refills | Status: DC

## 2021-06-22 NOTE — Patient Instructions (Addendum)
Order- DME [facility]- please replace old CPAP machine auto 5-15, mask of choice, humidifier, supplies, AirView/ card ? ?Script sent for Trazodone- try 1-2 for sleep as needed ?

## 2021-06-29 ENCOUNTER — Other Ambulatory Visit: Payer: Self-pay | Admitting: Internal Medicine

## 2021-07-01 NOTE — Telephone Encounter (Signed)
Trazodone changed to 3 month supply ?

## 2021-07-01 NOTE — Telephone Encounter (Signed)
Changes Requested ? ? Name from pharmacy: TRAZODONE 50 MG TABLET  ?    ? Will file in chart as: traZODone (DESYREL) 50 MG tablet  ?  Possible duplicate: Hover to review recent actions on this medication  ? Sig: 1or 2 tabs for sleep as needed  ? Disp:  180 tablet    Refills:  1  ? Start: 06/29/2021  ? Class: Normal  ? Non-formulary  ? Last ordered: 1 week ago by Deneise Lever, MD Last refill: 06/22/2021  ? Rx #: [id number]  ? Pharmacy comment: REQUEST FOR 90 DAYS PRESCRIPTION.  ?   ?To be filled at: [id number]- [place],  - [address] AT [address]  ?  ? ? ?Dr.Young, please advise on this. ?

## 2021-07-20 ENCOUNTER — Encounter: Payer: Self-pay | Admitting: Internal Medicine

## 2021-07-20 NOTE — Assessment & Plan Note (Signed)
Has had right TKR which he is still protecting.  Following with orthopedics. ?

## 2021-07-20 NOTE — Assessment & Plan Note (Addendum)
Benefits from CPAP.  Encouraged to increase compliance.  Work rotation caused missed nights.  Knee pain causes short nights. ?Plan-replace old machine auto 5-15, Trazodone ?

## 2021-08-31 ENCOUNTER — Ambulatory Visit: Payer: 59 | Admitting: Family Medicine

## 2021-09-02 ENCOUNTER — Encounter: Payer: Self-pay | Admitting: Family Medicine

## 2021-09-02 ENCOUNTER — Ambulatory Visit: Payer: 59 | Admitting: Family Medicine

## 2021-09-02 VITALS — BP 111/69 | HR 68 | Temp 98.6°F | Ht 71.0 in | Wt 258.6 lb

## 2021-09-02 DIAGNOSIS — R7303 Prediabetes: Secondary | ICD-10-CM | POA: Diagnosis not present

## 2021-09-02 DIAGNOSIS — E782 Mixed hyperlipidemia: Secondary | ICD-10-CM

## 2021-09-02 DIAGNOSIS — I10 Essential (primary) hypertension: Secondary | ICD-10-CM

## 2021-09-02 LAB — POCT GLYCOSYLATED HEMOGLOBIN (HGB A1C)
HbA1c POC (<> result, manual entry): 5.6 % (ref 4.0–5.6)
HbA1c, POC (controlled diabetic range): 5.6 % (ref 0.0–7.0)
HbA1c, POC (prediabetic range): 5.6 % — AB (ref 5.7–6.4)
Hemoglobin A1C: 5.6 % (ref 4.0–5.6)

## 2021-09-02 MED ORDER — FLUTICASONE PROPIONATE 50 MCG/ACT NA SUSP: 2.0000 | Freq: Every day | NASAL | 6 refills | Status: DC

## 2021-09-02 MED ORDER — ATORVASTATIN CALCIUM 40 MG PO TABS: 40.0000 mg | ORAL_TABLET | Freq: Every day | ORAL | 3 refills | Status: DC

## 2021-09-02 MED ORDER — AMLODIPINE BESYLATE 10 MG PO TABS: 10.0000 mg | ORAL_TABLET | Freq: Every day | ORAL | 1 refills | Status: DC

## 2021-09-02 MED ORDER — FENOFIBRATE 145 MG PO TABS: ORAL_TABLET | ORAL | 1 refills | Status: DC

## 2021-09-02 MED ORDER — TIRZEPATIDE 5 MG/0.5ML ~~LOC~~ SOAJ: 5.0000 mg | SUBCUTANEOUS | 11 refills | Status: DC

## 2021-09-02 MED ORDER — LISINOPRIL-HYDROCHLOROTHIAZIDE 20-12.5 MG PO TABS
2.0000 | ORAL_TABLET | Freq: Every day | ORAL | 1 refills | Status: DC
Start: 2021-09-02 — End: 2021-09-02

## 2021-09-02 MED ORDER — LISINOPRIL-HYDROCHLOROTHIAZIDE 20-12.5 MG PO TABS: 2.0000 | ORAL_TABLET | Freq: Every day | ORAL | 1 refills | Status: DC

## 2021-09-02 NOTE — Patient Instructions (Signed)
No follow-ups on file.        Great to see you today.  I have refilled the medication(s) we provide.   If labs were collected, we will inform you of lab results once received either by echart message or telephone call.   - echart message- for normal results that have been seen by the patient already.   - telephone call: abnormal results or if patient has not viewed results in their echart.  

## 2021-09-02 NOTE — Progress Notes (Signed)
Patient ID: RIO KIDANE, male  DOB: 02/10/1966, 56 y.o.   MRN: [id number] Patient Care Team    Relationship Specialty Notifications Start End  Ma Hillock, DO PCP - General Family Medicine  07/12/17   Nahser, Wonda Cheng, MD  Cardiology  08/27/11   Deneise Lever, MD Consulting Physician Pulmonary Disease  07/12/17   Lavonna Monarch, MD Consulting Physician Dermatology  02/14/20   Lavena Bullion, DO Consulting Physician Gastroenterology  10/14/20     Chief Complaint  Patient presents with   Hypertension    Pt is not fasting    Subjective: ALGIS LEHENBAUER is a 56 y.o. male present for [facility] All past medical history, surgical history, allergies, family history, immunizations, medications and social history were updated in the electronic medical record today. All recent labs, ED visits and hospitalizations within the last year were reviewed.  prediabetes/weight loss counseling: A1c was 6.2 when collected for his preop surgical visit> started mounjaro 2.5 mg weekly.  He has been watching his diet and following a low glycemic index diet and is lost 5 pounds initially, then regain ed 3 lbs after his knee surgery.    Hypertension/hyperlipidemia/obesity/Hepatic steatosis/elevated liver enzymes/thrombocytopenia: Pt reports compliance with lisinopril-hctz , amlodipine 10 mg qd. Patient denies chest pain, shortness of breath, dizziness or lower extremity edema.  Patient has Lipitor and is tolerating. RF: Hypertension, hypertriglyceridemia, obesity.  Family history of stroke in his father.  Prior note: Elevated liver enzymes reported by his health at work labs.  After receiving his health at work papers we discontinued his Voltaren and Lipitor.  Timeline suggest Voltaren may have increased his LFTs in 2019, however they had been mildly elevated even up into that point.  He does report he is taking omega-3's and increasing his fiber now for his cholesterol.  His repeat lipid panel last month  looked great with no statin.  His father recently had a stroke and patient will benefit from adding back on cholesterol medicine for CV protection when able.  He states he has started a Mediterranean diet, he is no longer eating red meat.  He has started the Mobic in place of the Voltaren and feels it is working rather well to even better. His platelets had decreased 1 month ago and were commented on being enlarged.   Reviewed outside labs/EKG collected 08/19/2021 CBC: Within normal limits CMP: Glucose 98 BUN 20 creatinine 1.12 GFR 79 sodium 137, potassium 4 5, chloride 96, carbon dioxide 24, calcium 10.4, total protein 7.7, albumin 5.1, total globulin 2.6 TSH: 3.620 PSA: 2.7 Cholesterol: Total cholesterol 199, triglycerides 236, HDL 34, LDL 123.  EKG: NSR.  HR 64.  PR 182 ms.  QRS 104.  QTc 419.  Normal EKG.     04/21/2021   10:30 AM 10/14/2020    8:04 AM 04/29/2020    9:07 AM 01/25/2019    9:31 AM 07/12/2017    9:45 AM  Depression screen PHQ 2/9  Decreased Interest 0 0 0 0 0  Down, Depressed, Hopeless 0 0 0 0 0  PHQ - 2 Score 0 0 0 0 0       No data to display                 07/12/2017    9:45 AM  Fall Risk   Falls in the past year? No    Immunization History  Administered Date(s) Administered   Influenza Whole 12/14/2011   Influenza,inj,Quad PF,6+ Mos  01/12/2013, 12/15/2018, 04/29/2020   Influenza-Unspecified 12/13/2013, 11/29/2017   Td 08/05/2003   Tdap 09/15/2010, 10/14/2020   Zoster Recombinat (Shingrix) 10/14/2020, 04/21/2021     Past Medical History:  Diagnosis Date   Gout    Right large toe   Hematuria    pt reports chronic.    HTN (hypertension)    Dr. Acie Fredrickson   Hypercholesterolemia    Meniscal injury 1990   OSA (obstructive sleep apnea)    wears CPAP; Dr. 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    Nonsmoker.   Drinks caffeine.    Smoke alarm in the home. Wears her seatbelt.    Owns firearms.    Feels safe in his relationships.     Allergies as of 09/02/2021       Reactions   Erythromycin    Niacin And Related    Hives   Penicillins    Causes Hives        Medication List        Accurate as of September 02, 2021 12:04 PM. If you have any questions, ask your nurse or doctor.          STOP taking these medications    tirzepatide 2.5 MG/0.5ML Pen Commonly known as: MOUNJARO Replaced by: tirzepatide 5 MG/0.5ML Pen Stopped by: Howard Pouch, DO       TAKE these medications    alclomethasone 0.05 % cream Commonly known as: ACLOVATE APPLY TO AFFECTED AREA EVERY DAY TO TWICE A DAY   amLODipine 10 MG tablet Commonly known as: NORVASC Take 1 tablet (10 mg total) by mouth daily.   atorvastatin 40 MG tablet Commonly known as: LIPITOR Take 1 tablet (40 mg total) by mouth daily. What changed:  medication strength how much to take Changed by: Howard Pouch, DO   CELEBREX PO Take 1 tablet by mouth 2 (two) times daily.   clobetasol cream 0.05 % Commonly known as: TEMOVATE APPLY TO AFFECTED AREA 1-2 TIMES DAILY not for face or genitals   fenofibrate 145 MG tablet Commonly known  as: TRICOR TAKE 1 TAB DAILY WITH MEAL   fluticasone 50 MCG/ACT nasal spray Commonly known as: FLONASE Place 2 sprays into both nostrils daily.   lisinopril-hydrochlorothiazide 20-12.5 MG tablet Commonly known as: ZESTORETIC Take 2 tablets by mouth daily.  tirzepatide 5 MG/0.5ML Pen Commonly known as: MOUNJARO Inject 5 mg into the skin once a week. Replaces: tirzepatide 2.5 MG/0.5ML Pen Started by: Howard Pouch, DO   tiZANidine 2 MG tablet Commonly known as: ZANAFLEX Take 2 mg by mouth.   traZODone 50 MG tablet Commonly known as: DESYREL 1OR 2 TABS FOR SLEEP AS NEEDED       All past medical history, surgical history, allergies, family history, immunizations andmedications were updated in the EMR today and reviewed under the history and medication portions of their EMR.       ROS 14 pt review of systems performed and negative (unless mentioned in an HPI) Objective: BP 111/69   Pulse 68   Temp 98.6 F (37 C)   Ht '5\' 11"'$  (1.803 m)   Wt 258 lb 9.6 oz (117.3 kg)   SpO2 94%   BMI 36.07 kg/m  Physical Exam Vitals and nursing note reviewed.  Constitutional:      General: He is not in acute distress.    Appearance: Normal appearance. He is not ill-appearing, toxic-appearing or diaphoretic.  HENT:     Head: Normocephalic and atraumatic.  Eyes:     General: No scleral icterus.       Right eye: No discharge.        Left eye: No discharge.     Extraocular Movements: Extraocular movements intact.     Pupils: Pupils are equal, round, and reactive to light.  Cardiovascular:     Rate and Rhythm: Normal rate and regular rhythm.     Heart sounds: No murmur heard. Pulmonary:     Effort: Pulmonary effort is normal. No respiratory distress.     Breath sounds: Normal breath sounds. No wheezing, rhonchi or rales.  Musculoskeletal:     Right lower leg: No edema.     Left lower leg: No edema.  Skin:    General: Skin is warm and dry.     Coloration: Skin is not jaundiced or  pale.     Findings: No rash.  Neurological:     Mental Status: He is alert and oriented to person, place, and time. Mental status is at baseline.  Psychiatric:        Mood and Affect: Mood normal.        Behavior: Behavior normal.        Thought Content: Thought content normal.        Judgment: Judgment normal.     Results for orders placed or performed in visit on 09/02/21 (from the past 48 hour(s))  POCT HgB A1C     Status: Abnormal   Collection Time: 09/02/21  8:16 AM  Result Value Ref Range   Hemoglobin A1C 5.6 4.0 - 5.6 %   HbA1c POC (<> result, manual entry) 5.6 4.0 - 5.6 %   HbA1c, POC (prediabetic range) 5.6 (A) 5.7 - 6.4 %   HbA1c, POC (controlled diabetic range) 5.6 0.0 - 7.0 %     Assessment/plan: NIKKI RUSNAK is a 56 y.o. male present for [facility] morbid obesity/diabetes: A1c 6.2> dietary changes> A1c today is 5.5>5.6 Increase Mounjaro 5 mg weekly Increase exercise.    Mixed hyperlipidemia/Essential hypertension Stable.  Continue lisinopril 20 mg - hctz 12.5 mg (2 TABS) daily. Continue Amlodipine 10 mg qd> may be causing some LE edema. Increase  Lipitor 40 mg nightly for CV protection with family history of stroke in his father (LDL 123 at recent outside labs viewed today with patient) - continue fish  oil as long as plt remain stable  - higher fiber diet/Metamucil - Fhx of recent stroke in his father.  OSA: Compliant with cpap   Thrombocytopenia: Has been stable. Borderline thrombocytopenia.  No concerns.   Return in about 24 weeks (around 02/17/2022) for cpe (20 min), Routine chronic condition follow-up.  Orders Placed This Encounter  Procedures   POCT HgB A1C   Orders Placed This Encounter  Procedures   POCT HgB A1C   Meds ordered this encounter  Medications   DISCONTD: amLODipine (NORVASC) 10 MG tablet    Sig: Take 1 tablet (10 mg total) by mouth daily.    Dispense:  90 tablet    Refill:  1   DISCONTD: fenofibrate (TRICOR) 145 MG tablet     Sig: TAKE 1 TAB DAILY WITH MEAL    Dispense:  90 tablet    Refill:  1   fluticasone (FLONASE) 50 MCG/ACT nasal spray    Sig: Place 2 sprays into both nostrils daily.    Dispense:  16 g    Refill:  6   DISCONTD: lisinopril-hydrochlorothiazide (ZESTORETIC) 20-12.5 MG tablet    Sig: Take 2 tablets by mouth daily.    Dispense:  180 tablet    Refill:  1   tirzepatide (MOUNJARO) 5 MG/0.5ML Pen    Sig: Inject 5 mg into the skin once a week.    Dispense:  2 mL    Refill:  11   atorvastatin (LIPITOR) 40 MG tablet    Sig: Take 1 tablet (40 mg total) by mouth daily.    Dispense:  90 tablet    Refill:  3   amLODipine (NORVASC) 10 MG tablet    Sig: Take 1 tablet (10 mg total) by mouth daily.    Dispense:  90 tablet    Refill:  1   fenofibrate (TRICOR) 145 MG tablet    Sig: TAKE 1 TAB DAILY WITH MEAL    Dispense:  90 tablet    Refill:  1   lisinopril-hydrochlorothiazide (ZESTORETIC) 20-12.5 MG tablet    Sig: Take 2 tablets by mouth daily.    Dispense:  180 tablet    Refill:  1   Referral Orders  No referral(s) requested today     Note is dictated utilizing voice recognition software. Although note has been proof read prior to signing, occasional typographical errors still can be missed. If any questions arise, please do not hesitate to call for verification.  Electronically signed by: Howard Pouch, DO [place]

## 2022-01-01 ENCOUNTER — Other Ambulatory Visit: Payer: Self-pay | Admitting: Internal Medicine

## 2022-01-04 NOTE — Telephone Encounter (Signed)
Trazodone refilled.

## 2022-02-15 ENCOUNTER — Ambulatory Visit: Payer: 59 | Admitting: Dermatology

## 2022-03-05 ENCOUNTER — Other Ambulatory Visit: Payer: Self-pay | Admitting: Family Medicine

## 2022-04-08 ENCOUNTER — Telehealth: Payer: Self-pay | Admitting: Family Medicine

## 2022-04-08 NOTE — Telephone Encounter (Signed)
Please advise. Pt is wanting to know if you are willing to take over Alclomethasone and clobetasol. Dermatology office is closing pt will be called to schedule appt for referral.

## 2022-04-08 NOTE — Telephone Encounter (Signed)
Pt is a patient of Dr. Jamesetta So at [facility]. This office has closed so he is asking if Dr. Raoul Pitch can take over his prescriptions because he is currently out. The prescriptions are Alclomethasone and clobetasol. He is also asking for a referral to a new dermatologist being as they are unsure if they will ever reopen. Please advise patient.

## 2022-04-09 ENCOUNTER — Other Ambulatory Visit: Payer: Self-pay

## 2022-04-12 NOTE — Telephone Encounter (Signed)
Pt is calling back to follow up on the status of his previous message regarding medication management. Please contact patient to provide with an update.

## 2022-04-12 NOTE — Telephone Encounter (Signed)
We can refer to a new dermatologist if desired. It is possible I could take over the prescription management, depending upon what the diagnostic code is.  What is he using this medication to treat?  Please make him aware that if we do decide we can take over management, he would need an appointment with [place] in order to take over the management and document appropriately.

## 2022-04-12 NOTE — Telephone Encounter (Signed)
Please advise 

## 2022-04-12 NOTE — Telephone Encounter (Signed)
Spoke with pt and sched for appt. Pt states he is taking the medication for psoriasis

## 2022-04-14 ENCOUNTER — Encounter: Payer: Self-pay | Admitting: Family Medicine

## 2022-04-14 ENCOUNTER — Ambulatory Visit (INDEPENDENT_AMBULATORY_CARE_PROVIDER_SITE_OTHER): Payer: 59 | Admitting: Family Medicine

## 2022-04-14 VITALS — BP 134/85 | HR 66 | Temp 98.1°F | Wt 265.2 lb

## 2022-04-14 DIAGNOSIS — R7303 Prediabetes: Secondary | ICD-10-CM

## 2022-04-14 DIAGNOSIS — I10 Essential (primary) hypertension: Secondary | ICD-10-CM

## 2022-04-14 DIAGNOSIS — K760 Fatty (change of) liver, not elsewhere classified: Secondary | ICD-10-CM

## 2022-04-14 DIAGNOSIS — E782 Mixed hyperlipidemia: Secondary | ICD-10-CM

## 2022-04-14 DIAGNOSIS — L409 Psoriasis, unspecified: Secondary | ICD-10-CM | POA: Insufficient documentation

## 2022-04-14 LAB — POCT GLYCOSYLATED HEMOGLOBIN (HGB A1C)
HbA1c POC (<> result, manual entry): 5.9 % (ref 4.0–5.6)
HbA1c, POC (controlled diabetic range): 5.9 % (ref 0.0–7.0)
HbA1c, POC (prediabetic range): 5.9 % (ref 5.7–6.4)
Hemoglobin A1C: 5.9 % — AB (ref 4.0–5.6)

## 2022-04-14 MED ORDER — TIRZEPATIDE 5 MG/0.5ML ~~LOC~~ SOAJ: 5.0000 mg | SUBCUTANEOUS | 3 refills | Status: DC

## 2022-04-14 MED ORDER — TIRZEPATIDE 2.5 MG/0.5ML ~~LOC~~ SOAJ: 2.5000 mg | SUBCUTANEOUS | 0 refills | Status: DC

## 2022-04-14 MED ORDER — AMLODIPINE BESYLATE 10 MG PO TABS: 10.0000 mg | ORAL_TABLET | Freq: Every day | ORAL | 1 refills | Status: DC

## 2022-04-14 MED ORDER — LISINOPRIL-HYDROCHLOROTHIAZIDE 20-12.5 MG PO TABS: 2.0000 | ORAL_TABLET | Freq: Every day | ORAL | 1 refills | Status: DC

## 2022-04-14 MED ORDER — ATORVASTATIN CALCIUM 40 MG PO TABS: 40.0000 mg | ORAL_TABLET | Freq: Every day | ORAL | 3 refills | Status: DC

## 2022-04-14 MED ORDER — ALCLOMETASONE DIPROPIONATE 0.05 % EX CREA: TOPICAL_CREAM | CUTANEOUS | 6 refills | Status: DC

## 2022-04-14 MED ORDER — FENOFIBRATE 145 MG PO TABS
ORAL_TABLET | ORAL | 1 refills | Status: DC
Start: 2022-04-14 — End: 2022-09-27

## 2022-04-14 MED ORDER — CLOBETASOL PROPIONATE 0.05 % EX CREA: TOPICAL_CREAM | CUTANEOUS | 4 refills | Status: DC

## 2022-04-14 NOTE — Progress Notes (Signed)
Jacob Aguirre , 06-Mar-1966, 57 y.o., male MRN: [id number] Patient Care Team    Relationship Specialty Notifications Start End  Ma Hillock, DO PCP - General Family Medicine  07/12/17   Nahser, Wonda Cheng, MD  Cardiology  08/27/11   Deneise Lever, MD Consulting Physician Pulmonary Disease  07/12/17   Lavonna Monarch, MD (Inactive) Consulting Physician Dermatology  02/14/20   Lavena Bullion, DO Consulting Physician Gastroenterology  10/14/20     Chief Complaint  Patient presents with   Psoriasis   Hypertension     Subjective: Jacob Aguirre is a 57 y.o. Pt presents for an OV with complaints of psoriasis. He had been established with dermatology for this condition and prescribed clobetasol and aclovate.  He reports the dermatologist office has closed and is not reopening. He would like to have his prescriptions managed by this provider.    Hypertension/hyperlipidemia/obesity/Hepatic steatosis/elevated liver enzymes/thrombocytopenia: Pt reports compliance with lisinopril-hctz , amlodipine 10 mg qd.  Patient denies chest pain, shortness of breath, dizziness or lower extremity edema.  Patient has Lipitor and is tolerating. RF: Hypertension, hypertriglyceridemia, obesity.  Family history of stroke in his father.   Psoriasis: Patient reports he has psoriasis on his arms, genitals and legs.  His dermatologist office is closed.  He is wondering if we can take over this prescription for him.  Prediabetes: Patient had been on Mounjaro 5 mg for his diabetes/prediabetes/obesity but had to discontinue secondary to change in insurance coverage.  He would like to restart medication.     04/21/2021   10:30 AM 10/14/2020    8:04 AM 04/29/2020    9:07 AM 01/25/2019    9:31 AM 07/12/2017    9:45 AM  Depression screen PHQ 2/9  Decreased Interest 0 0 0 0 0  Down, Depressed, Hopeless 0 0 0 0 0  PHQ - 2 Score 0 0 0 0 0    Allergies  Allergen Reactions   Erythromycin    Niacin And  Related     Hives   Penicillins     Causes Hives   Social History   Social History Narrative   Married.   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    Nonsmoker.   Drinks caffeine.    Smoke alarm in the home. Wears her seatbelt.    Owns firearms.    Feels safe in his relationships.    Past Medical History:  Diagnosis Date   Gout    Right large toe   Hematuria    pt reports chronic.    HTN (hypertension)    Dr. Acie Fredrickson   Hypercholesterolemia    Meniscal injury 1990   OSA (obstructive sleep apnea)    wears CPAP; Dr. Annamaria Boots   Sleep apnea    cpap   Tinea corporis    Past Surgical History:  Procedure Laterality Date   FINGER SURGERY     KNEE ARTHROSCOPY W/ MENISCAL REPAIR Right 1990   KNEE SURGERY     Broken Knee Cap   pilonidal cyst resection     POPLITEAL SYNOVIAL CYST EXCISION Right    3rd grade   REPLACEMENT TOTAL KNEE Right 05/2021   Family History  Problem Relation Age of Onset   Heart murmur Father    Hearing loss Father    Colon polyps Father    Stroke Father    Hypertension Mother    COPD Mother    Testicular cancer Brother 25  And prostate cancer   Colon cancer Neg Hx    Esophageal cancer Neg Hx    Rectal cancer Neg Hx    Stomach cancer Neg Hx    Allergies as of 04/14/2022       Reactions   Erythromycin    Niacin And Related    Hives   Penicillins    Causes Hives        Medication List        Accurate as of April 14, 2022  5:13 PM. If you have any questions, ask your nurse or doctor.          STOP taking these medications    CELEBREX PO Stopped by: Howard Pouch, DO   tirzepatide 5 MG/0.5ML Pen Commonly known as: MOUNJARO Replaced by: tirzepatide 2.5 MG/0.5ML Pen Stopped by: Howard Pouch, DO   tiZANidine 2 MG tablet Commonly known as: ZANAFLEX Stopped by: Howard Pouch, DO   traZODone 50 MG tablet Commonly known as: DESYREL Stopped by: Howard Pouch, DO       TAKE these medications    alclomethasone 0.05 %  cream Commonly known as: ACLOVATE APPLY TO AFFECTED AREA EVERY DAY TO TWICE A DAY   amLODipine 10 MG tablet Commonly known as: NORVASC Take 1 tablet (10 mg total) by mouth daily.   atorvastatin 40 MG tablet Commonly known as: LIPITOR Take 1 tablet (40 mg total) by mouth daily.   clobetasol cream 0.05 % Commonly known as: TEMOVATE APPLY TO AFFECTED AREA 1-2 TIMES DAILY not for face or genitals   fenofibrate 145 MG tablet Commonly known as: TRICOR TAKE 1 TAB DAILY WITH MEAL   fluticasone 50 MCG/ACT nasal spray Commonly known as: FLONASE SPRAY 2 SPRAYS INTO EACH NOSTRIL EVERY DAY   lisinopril-hydrochlorothiazide 20-12.5 MG tablet Commonly known as: ZESTORETIC Take 2 tablets by mouth daily.   tirzepatide 2.5 MG/0.5ML Pen Commonly known as: MOUNJARO Inject 2.5 mg into the skin once a week. Replaces: tirzepatide 5 MG/0.5ML Pen Started by: Howard Pouch, DO   tirzepatide 5 MG/0.5ML Pen Commonly known as: MOUNJARO Inject 5 mg into the skin once a week. Start taking on: May 12, 2022 Started by: Howard Pouch, DO        All past medical history, surgical history, allergies, family history, immunizations andmedications were updated in the EMR today and reviewed under the history and medication portions of their EMR.     ROS Negative, with the exception of above mentioned in HPI   Objective:  BP 134/85   Pulse 66   Temp 98.1 F (36.7 C)   Wt 265 lb 3.2 oz (120.3 kg)   SpO2 98%   BMI 36.99 kg/m  Body mass index is 36.99 kg/m. Physical Exam Vitals and nursing note reviewed.  Constitutional:      General: He is not in acute distress.    Appearance: Normal appearance. He is not ill-appearing, toxic-appearing or diaphoretic.  HENT:     Head: Normocephalic and atraumatic.  Eyes:     General: No scleral icterus.       Right eye: No discharge.        Left eye: No discharge.     Extraocular Movements: Extraocular movements intact.     Pupils: Pupils are equal,  round, and reactive to light.  Cardiovascular:     Rate and Rhythm: Normal rate and regular rhythm.  Pulmonary:     Effort: Pulmonary effort is normal. No respiratory distress.     Breath sounds: Normal breath  sounds. No wheezing, rhonchi or rales.  Musculoskeletal:     Right lower leg: No edema.     Left lower leg: No edema.  Skin:    General: Skin is warm.     Findings: No rash.  Neurological:     Mental Status: He is alert and oriented to person, place, and time. Mental status is at baseline.  Psychiatric:        Mood and Affect: Mood normal.        Behavior: Behavior normal.        Thought Content: Thought content normal.        Judgment: Judgment normal.     No results found. No results found. Results for orders placed or performed in visit on 04/14/22 (from the past 24 hour(s))  POCT HgB A1C     Status: Abnormal   Collection Time: 04/14/22  5:00 PM  Result Value Ref Range   Hemoglobin A1C 5.9 (A) 4.0 - 5.6 %   HbA1c POC (<> result, manual entry) 5.9 4.0 - 5.6 %   HbA1c, POC (prediabetic range) 5.9 5.7 - 6.4 %   HbA1c, POC (controlled diabetic range) 5.9 0.0 - 7.0 %    Assessment/Plan: Jacob Aguirre is a 57 y.o. male present for OV for  Psoriasis Stable Continue clobetasol Continue Aclovate and more sensitive/thin-skinned areas.  Prediabetes/morbid obesity/hyperlipidemia/hepatic steatosis patient had been in the diabetic range and started on Mounjaro unfortunately his insurance lapsed and he was changing insurances and needed to discontinue Mounjaro due to cost.  His A1c today is 5.9 and starting to rise again without treatment. Will start Mounjaro treatment over the 2.5 mg dose and taper up to 5 mg in 4 weeks.  Further tapering after diabetes follow-up in 4 months. - POCT HgB A1C 6.2> 5.5> 5.6> 5.9  Primary hypertension/hyperlipidemia/morbid obesity/family history of stroke in his father Stable Continue lisinopril 20-HCTZ 12.5 mg 2 tabs daily. Continue  amlodipine 10 mg daily Continue Lipitor 40 mg nightly. Continue fish oil as long as platelets remain stable. Continue Metamucil/fiber supplement twice daily Labs due next visit.    Reviewed expectations re: course of current medical issues. Discussed self-management of symptoms. Outlined signs and symptoms indicating need for more acute intervention. Patient verbalized understanding and all questions were answered. Patient received an After-Visit Summary.    Orders Placed This Encounter  Procedures   POCT HgB A1C   Meds ordered this encounter  Medications   alclomethasone (ACLOVATE) 0.05 % cream    Sig: APPLY TO AFFECTED AREA EVERY DAY TO TWICE A DAY    Dispense:  60 g    Refill:  6   clobetasol cream (TEMOVATE) 0.05 %    Sig: APPLY TO AFFECTED AREA 1-2 TIMES DAILY not for face or genitals    Dispense:  60 g    Refill:  4    Please dispense in 60g tube only   amLODipine (NORVASC) 10 MG tablet    Sig: Take 1 tablet (10 mg total) by mouth daily.    Dispense:  90 tablet    Refill:  1   atorvastatin (LIPITOR) 40 MG tablet    Sig: Take 1 tablet (40 mg total) by mouth daily.    Dispense:  90 tablet    Refill:  3   fenofibrate (TRICOR) 145 MG tablet    Sig: TAKE 1 TAB DAILY WITH MEAL    Dispense:  90 tablet    Refill:  1   lisinopril-hydrochlorothiazide (ZESTORETIC) 20-12.5  MG tablet    Sig: Take 2 tablets by mouth daily.    Dispense:  180 tablet    Refill:  1   tirzepatide (MOUNJARO) 2.5 MG/0.5ML Pen    Sig: Inject 2.5 mg into the skin once a week.    Dispense:  2 mL    Refill:  0    Pt needs to restart med- had a lapse in tx d/t to Insurance change   tirzepatide [facility]) 5 MG/0.5ML Pen    Sig: Inject 5 mg into the skin once a week.    Dispense:  2 mL    Refill:  3   Referral Orders  No referral(s) requested today     Note is dictated utilizing voice recognition software. Although note has been proof read prior to signing, occasional typographical errors  still can be missed. If any questions arise, please do not hesitate to call for verification.   electronically signed by:  Howard Pouch, DO  [place]

## 2022-04-14 NOTE — Patient Instructions (Addendum)
Return in about 15 weeks (around 07/28/2022) for Routine chronic condition follow-up.  Follow up per schedule for all chronic conditions.       Great to see you today.  I have refilled the medication(s) we provide.   If labs were collected, we will inform you of lab results once received either by echart message or telephone call.   - echart message- for normal results that have been seen by the patient already.   - telephone call: abnormal results or if patient has not viewed results in their echart.

## 2022-05-03 ENCOUNTER — Other Ambulatory Visit: Payer: Self-pay

## 2022-05-06 ENCOUNTER — Other Ambulatory Visit: Payer: Self-pay

## 2022-05-06 MED ORDER — FLUTICASONE PROPIONATE 50 MCG/ACT NA SUSP: NASAL | 1 refills | Status: DC

## 2022-05-13 ENCOUNTER — Other Ambulatory Visit (HOSPITAL_COMMUNITY): Payer: Self-pay

## 2022-05-17 ENCOUNTER — Telehealth: Payer: Self-pay

## 2022-05-17 ENCOUNTER — Other Ambulatory Visit (HOSPITAL_COMMUNITY): Payer: Self-pay

## 2022-05-17 NOTE — Telephone Encounter (Signed)
Noted  

## 2022-05-17 NOTE — Telephone Encounter (Signed)
Pharmacy Patient Advocate Encounter   Received notification that prior authorization for Mounjaroc2.'5mg'$ /0.5m is required/requested.     PA submitted on 05/17/22 to (ins) [employer] via CoverMyMeds Key #  [id number] is pending

## 2022-05-18 NOTE — Telephone Encounter (Signed)
Patient aware PA requested and submitted.  Status is pending.

## 2022-05-24 NOTE — Telephone Encounter (Signed)
noted 

## 2022-05-24 NOTE — Telephone Encounter (Signed)
Pharmacy Patient Advocate Encounter  Received notification from [employer] that the request for prior authorization for Jacob Aguirre has been denied due to .    Please be advised we currently do not have a Pharmacist to review denials, therefore you will need to process appeals accordingly as needed. Thanks for your support at this time.   You may Fax [phone], to appeal.

## 2022-06-05 ENCOUNTER — Other Ambulatory Visit: Payer: Self-pay | Admitting: Family Medicine

## 2022-09-29 ENCOUNTER — Ambulatory Visit: Payer: 59 | Admitting: Family Medicine

## 2022-09-29 VITALS — BP 130/84 | HR 57 | Temp 98.0°F | Wt 259.6 lb

## 2022-09-29 DIAGNOSIS — R7303 Prediabetes: Secondary | ICD-10-CM

## 2022-09-29 DIAGNOSIS — E782 Mixed hyperlipidemia: Secondary | ICD-10-CM

## 2022-09-29 DIAGNOSIS — D696 Thrombocytopenia, unspecified: Secondary | ICD-10-CM

## 2022-09-29 DIAGNOSIS — K760 Fatty (change of) liver, not elsewhere classified: Secondary | ICD-10-CM

## 2022-09-29 DIAGNOSIS — I10 Essential (primary) hypertension: Secondary | ICD-10-CM | POA: Diagnosis not present

## 2022-09-29 DIAGNOSIS — G4733 Obstructive sleep apnea (adult) (pediatric): Secondary | ICD-10-CM

## 2022-09-29 LAB — COMPREHENSIVE METABOLIC PANEL
ALT: 40 U/L (ref 0–53)
AST: 24 U/L (ref 0–37)
Albumin: 4.6 g/dL (ref 3.5–5.2)
Alkaline Phosphatase: 43 U/L (ref 39–117)
BUN: 29 mg/dL — ABNORMAL HIGH (ref 6–23)
CO2: 29 mEq/L (ref 19–32)
Calcium: 10.1 mg/dL (ref 8.4–10.5)
Chloride: 99 mEq/L (ref 96–112)
Creatinine, Ser: 1.17 mg/dL (ref 0.40–1.50)
GFR: 70.38 mL/min (ref >60.00)
Glucose, Bld: 88 mg/dL (ref 70–99)
Potassium: 4.2 mEq/L (ref 3.5–5.1)
Sodium: 138 mEq/L (ref 135–145)
Total Bilirubin: 0.7 mg/dL (ref 0.2–1.2)
Total Protein: 7.1 g/dL (ref 6.0–8.3)

## 2022-09-29 LAB — TSH: TSH: 5.72 u[IU]/mL — ABNORMAL HIGH (ref 0.35–5.50)

## 2022-09-29 LAB — CBC
HCT: 47.8 % (ref 39.0–52.0)
Hemoglobin: 15.8 g/dL (ref 13.0–17.0)
MCHC: 33.1 g/dL (ref 30.0–36.0)
MCV: 89.4 fl (ref 78.0–100.0)
Platelets: 145 10*3/uL — ABNORMAL LOW (ref 150.0–400.0)
RBC: 5.34 Mil/uL (ref 4.22–5.81)
RDW: 13.6 % (ref 11.5–15.5)
WBC: 8.5 10*3/uL (ref 4.0–10.5)

## 2022-09-29 LAB — LIPID PANEL
Cholesterol: 156 mg/dL (ref 0–200)
HDL: 30.8 mg/dL — ABNORMAL LOW (ref >39.00)
NonHDL: 125.21
Total CHOL/HDL Ratio: 5
Triglycerides: 278 mg/dL — ABNORMAL HIGH (ref 0.0–149.0)
VLDL: 55.6 mg/dL — ABNORMAL HIGH (ref 0.0–40.0)

## 2022-09-29 LAB — LDL CHOLESTEROL, DIRECT: Direct LDL: 82 mg/dL

## 2022-09-29 LAB — HEMOGLOBIN A1C: Hgb A1c MFr Bld: 5.6 % (ref 4.6–6.5)

## 2022-09-29 MED ORDER — ALCLOMETASONE DIPROPIONATE 0.05 % EX CREA: TOPICAL_CREAM | CUTANEOUS | 6 refills | Status: DC

## 2022-09-29 MED ORDER — LISINOPRIL-HYDROCHLOROTHIAZIDE 20-12.5 MG PO TABS: 2.0000 | ORAL_TABLET | Freq: Every day | ORAL | 1 refills | Status: DC

## 2022-09-29 MED ORDER — FENOFIBRATE 145 MG PO TABS: ORAL_TABLET | ORAL | 1 refills | Status: DC

## 2022-09-29 MED ORDER — CLOBETASOL PROPIONATE 0.05 % EX CREA: TOPICAL_CREAM | CUTANEOUS | 4 refills | Status: DC

## 2022-09-29 MED ORDER — AMLODIPINE BESYLATE 10 MG PO TABS: 10.0000 mg | ORAL_TABLET | Freq: Every day | ORAL | 1 refills | Status: DC

## 2022-09-29 NOTE — Progress Notes (Signed)
Jacob Aguirre , [birth year], 57 y.o., male MRN: [id number] Patient Care Team    Relationship Specialty Notifications Start End  Natalia Leatherwood, DO PCP - General Family Medicine  07/12/17   Nahser, Deloris Ping, MD  Cardiology  08/27/11   Waymon Budge, MD Consulting Physician Pulmonary Disease  07/12/17   Janalyn Harder, MD (Inactive) Consulting Physician Dermatology  02/14/20   Shellia Cleverly, DO Consulting Physician Gastroenterology  10/14/20     Chief Complaint  Patient presents with   Hypertension    Non fasting. follow up on Bp. No questions or concerns.     Subjective: Jacob Aguirre is a 57 y.o. Pt presents for an OV  for Chronic Conditions/illness Management   Hypertension/hyperlipidemia/obesity/Hepatic steatosis/elevated liver enzymes/thrombocytopenia: Pt reports compliance with lisinopril-hctz , amlodipine 10 mg qd.  Patient denies chest pain, shortness of breath, dizziness or lower extremity edema.   Patient has Lipitor and is tolerating. RF: Hypertension, hypertriglyceridemia, obesity.  Family history of stroke in his father.   Psoriasis: Patient reports he has psoriasis on his arms, genitals and legs.  Had been cared for by dermatology until their office closed.  He reports condition is well-controlled on Aclovate and clobetasol cream.  Prediabetes: Patient had been on Mounjaro until prescription coverage changed. He has started seeing a weight loss clinic.      09/29/2022    8:22 AM 06/29/2022    2:49 PM 04/21/2021   10:30 AM 10/14/2020    8:04 AM 04/29/2020    9:07 AM  Depression screen PHQ 2/9  Decreased Interest 0 0 0 0 0  Down, Depressed, Hopeless 0 0 0 0 0  PHQ - 2 Score 0 0 0 0 0  Altered sleeping 2      Tired, decreased energy 1      Change in appetite 0      Feeling bad or failure about yourself  0      Trouble concentrating 0      Moving slowly or fidgety/restless 0      Suicidal thoughts 0      PHQ-9 Score 3      Difficult doing work/chores  Not difficult at all        Allergies  Allergen Reactions   Erythromycin    Niacin And Related     Hives   Penicillins     Causes Hives   Social History   Social History Narrative   Married.   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    Nonsmoker.   Drinks caffeine.    Smoke alarm in the home. Wears her seatbelt.    Owns firearms.    Feels safe in his relationships.    Past Medical History:  Diagnosis Date   Gout    Right large toe   Hematuria    pt reports chronic.    HTN (hypertension)    Dr. Elease Hashimoto   Hypercholesterolemia    Meniscal injury 1990   OSA (obstructive sleep apnea)    wears CPAP; Dr. Maple Hudson   Sleep apnea    cpap   Tinea corporis    Past Surgical History:  Procedure Laterality Date   FINGER SURGERY     KNEE ARTHROSCOPY W/ MENISCAL REPAIR Right 1990   KNEE SURGERY     Broken Knee Cap   pilonidal cyst resection     POPLITEAL SYNOVIAL CYST EXCISION Right    3rd grade   REPLACEMENT TOTAL KNEE Right 05/2021  Family History  Problem Relation Age of Onset   Heart murmur Father    Hearing loss Father    Colon polyps Father    Stroke Father    Hypertension Mother    COPD Mother    Testicular cancer Brother 14       And prostate cancer   Colon cancer Neg Hx    Esophageal cancer Neg Hx    Rectal cancer Neg Hx    Stomach cancer Neg Hx    Allergies as of 09/29/2022       Reactions   Erythromycin    Niacin And Related    Hives   Penicillins    Causes Hives        Medication List        Accurate as of September 29, 2022  8:44 AM. If you have any questions, ask your nurse or doctor.          STOP taking these medications    tirzepatide 2.5 MG/0.5ML Pen Commonly known as: MOUNJARO   tirzepatide 5 MG/0.5ML Pen Commonly known as: MOUNJARO       TAKE these medications    alclomethasone 0.05 % cream Commonly known as: ACLOVATE APPLY TO AFFECTED AREA EVERY DAY TO TWICE A DAY   amLODipine 10 MG tablet Commonly known as: NORVASC Take 1  tablet (10 mg total) by mouth daily.   atorvastatin 40 MG tablet Commonly known as: LIPITOR Take 1 tablet (40 mg total) by mouth daily.   clobetasol cream 0.05 % Commonly known as: TEMOVATE APPLY TO AFFECTED AREA 1-2 TIMES DAILY not for face or genitals   fenofibrate 145 MG tablet Commonly known as: TRICOR TAKE 1 TAB DAILY WITH MEAL   fluticasone 50 MCG/ACT nasal spray Commonly known as: FLONASE SPRAY 2 SPRAYS INTO EACH NOSTRIL EVERY DAY   lisinopril-hydrochlorothiazide 20-12.5 MG tablet Commonly known as: ZESTORETIC Take 2 tablets by mouth daily.        All past medical history, surgical history, allergies, family history, immunizations andmedications were updated in the EMR today and reviewed under the history and medication portions of their EMR.     ROS Negative, with the exception of above mentioned in HPI   Objective:  BP 130/84   Pulse (!) 57   Temp 98 F (36.7 C) (Oral)   Wt 259 lb 9.6 oz (117.8 kg)   SpO2 97%   BMI 36.21 kg/m  Body mass index is 36.21 kg/m. Physical Exam Vitals and nursing note reviewed.  Constitutional:      General: He is not in acute distress.    Appearance: Normal appearance. He is not ill-appearing, toxic-appearing or diaphoretic.  HENT:     Head: Normocephalic and atraumatic.  Eyes:     General: No scleral icterus.       Right eye: No discharge.        Left eye: No discharge.     Extraocular Movements: Extraocular movements intact.     Pupils: Pupils are equal, round, and reactive to light.  Cardiovascular:     Rate and Rhythm: Normal rate and regular rhythm.     Heart sounds: No murmur heard. Pulmonary:     Effort: Pulmonary effort is normal. No respiratory distress.     Breath sounds: Normal breath sounds. No wheezing, rhonchi or rales.  Musculoskeletal:     Right lower leg: No edema.     Left lower leg: No edema.  Skin:    General: Skin is warm.  Findings: No rash.  Neurological:     Mental Status: He is alert  and oriented to person, place, and time. Mental status is at baseline.  Psychiatric:        Mood and Affect: Mood normal.        Behavior: Behavior normal.        Thought Content: Thought content normal.        Judgment: Judgment normal.     No results found. No results found. No results found for this or any previous visit (from the past 24 hour(s)).   Assessment/Plan: VELMA HANNA is a 57 y.o. male present for OV for  Psoriasis Stable Continue clobetasol Continue Aclovate and more sensitive/thin-skinned areas.  Prediabetes/morbid obesity/hyperlipidemia/hepatic steatosis - POCT HgB A1C 6.2> 5.5> 5.6> 5.9>collected today   Primary hypertension/hyperlipidemia/morbid obesity/family history of stroke in his father Stable Continue lisinopril 20-HCTZ 12.5 mg 2 tabs daily. Continue amlodipine 10 mg daily Continue Lipitor 40 mg nightly. Continue fish oil as long as platelets remain stable. Continue Metamucil/fiber supplement twice daily CBC, CMP, lipid and TSH collected today Cardiac CT ordered today to risk stratify- may be candidate for ozempic/wegovy  Reviewed expectations re: course of current medical issues. Discussed self-management of symptoms. Outlined signs and symptoms indicating need for more acute intervention. Patient verbalized understanding and all questions were answered. Patient received an After-Visit Summary.    Orders Placed This Encounter  Procedures   CT CARDIAC SCORING (SELF PAY ONLY)   Lipid panel   Hemoglobin A1c   CBC   Comp Met (CMET)   TSH   Meds ordered this encounter  Medications   amLODipine (NORVASC) 10 MG tablet    Sig: Take 1 tablet (10 mg total) by mouth daily.    Dispense:  90 tablet    Refill:  1   fenofibrate (TRICOR) 145 MG tablet    Sig: TAKE 1 TAB DAILY WITH MEAL    Dispense:  90 tablet    Refill:  1   lisinopril-hydrochlorothiazide (ZESTORETIC) 20-12.5 MG tablet    Sig: Take 2 tablets by mouth daily.    Dispense:   180 tablet    Refill:  1   alclomethasone (ACLOVATE) 0.05 % cream    Sig: APPLY TO AFFECTED AREA EVERY DAY TO TWICE A DAY    Dispense:  60 g    Refill:  6   clobetasol cream (TEMOVATE) 0.05 %    Sig: APPLY TO AFFECTED AREA 1-2 TIMES DAILY not for face or genitals    Dispense:  60 g    Refill:  4    Please dispense in 60g tube only   Referral Orders  No referral(s) requested today     Note is dictated utilizing voice recognition software. Although note has been proof read prior to signing, occasional typographical errors still can be missed. If any questions arise, please do not hesitate to call for verification.   electronically signed by:  Felix Pacini, DO  [facility] [place]

## 2022-09-29 NOTE — Patient Instructions (Addendum)
Return in about 24 weeks (around 03/16/2023) for Routine chronic condition follow-up.        Great to see you today.  I have refilled the medication(s) we provide.   If labs were collected, we will inform you of lab results once received either by echart message or telephone call.   - echart message- for normal results that have been seen by the patient already.   - telephone call: abnormal results or if patient has not viewed results in their echart.

## 2022-09-30 ENCOUNTER — Telehealth: Payer: Self-pay | Admitting: Family Medicine

## 2022-09-30 MED ORDER — LEVOTHYROXINE SODIUM 25 MCG PO TABS: 25.0000 ug | ORAL_TABLET | Freq: Every day | ORAL | 0 refills | Status: DC

## 2022-09-30 NOTE — Telephone Encounter (Signed)
Please call patient Liver and kidney function are normal. Blood cell counts and electrolytes are normal Diabetes screening/A1c is normal at 5.6 Cholesterol panel-DL/bad cholesterol is good at 82.  His good cholesterol was low and his triglycerides are mildly elevated.  Thyroid levels are abnormal.  He has had abnormal TSH in the past, that then return to high and normal.,  His TSH is now again elevated at 5.72.  Ideal range for age would be a TSH less than 3.  I would recommend we start a thyroid supplement for him to take daily on an empty stomach.  The thyroid plays a role in energy levels and metabolism.  Abnormal thyroid levels can cause weight gain.  Getting the thyroid level at goal will help him with his weight loss and metabolism issues.  Encouraged him to add omega-3 2000 mg daily to help increase HDL and lower triglycerides   Will need to follow-up on the thyroid levels in 8 weeks with provider appointment.  We will recollect levels on that date and if needed will taper up on dose to get him at goal.  As far as the weight loss medication, we will need to wait on the coronary CT results, and if able we will prescribe at that time.

## 2022-09-30 NOTE — Telephone Encounter (Signed)
LM for pt to return call to discuss.  

## 2022-10-04 NOTE — Telephone Encounter (Signed)
Patient returning call about lab results.  Please call patient back when available.  

## 2022-10-04 NOTE — Telephone Encounter (Signed)
LM for pt to return call to discuss.  

## 2022-10-08 ENCOUNTER — Telehealth: Payer: Self-pay

## 2022-10-08 NOTE — Telephone Encounter (Signed)
Results and recommendations given to pt.

## 2022-10-13 ENCOUNTER — Telehealth: Payer: Self-pay | Admitting: Family Medicine

## 2022-10-13 ENCOUNTER — Ambulatory Visit (INDEPENDENT_AMBULATORY_CARE_PROVIDER_SITE_OTHER): Payer: Self-pay

## 2022-10-13 DIAGNOSIS — E782 Mixed hyperlipidemia: Secondary | ICD-10-CM

## 2022-10-13 DIAGNOSIS — K760 Fatty (change of) liver, not elsewhere classified: Secondary | ICD-10-CM

## 2022-10-13 DIAGNOSIS — I10 Essential (primary) hypertension: Secondary | ICD-10-CM

## 2022-10-13 DIAGNOSIS — G4733 Obstructive sleep apnea (adult) (pediatric): Secondary | ICD-10-CM

## 2022-10-13 NOTE — Telephone Encounter (Signed)
Spoke with patient regarding results/recommendations.  

## 2022-10-13 NOTE — Telephone Encounter (Signed)
Please inform patient his coronary calcium score is excellent.  Best score available of 0. This is great news for his heart, but this also means we do not have the diagnostic codes we can order the [facility] or [facility] under.

## 2022-11-27 ENCOUNTER — Other Ambulatory Visit: Payer: Self-pay | Admitting: Family Medicine

## 2022-12-29 ENCOUNTER — Other Ambulatory Visit: Payer: Self-pay | Admitting: Family Medicine

## 2023-03-23 ENCOUNTER — Other Ambulatory Visit: Payer: Self-pay | Admitting: Family Medicine

## 2023-03-30 NOTE — Progress Notes (Signed)
Jacob Aguirre , [birth year], 58 y.o., male MRN: [id number] Patient Care Team    Relationship Specialty Notifications Start End  Natalia Leatherwood, DO PCP - General Family Medicine  07/12/17   Nahser, Deloris Ping, MD  Cardiology  08/27/11   Waymon Budge, MD Consulting Physician Pulmonary Disease  07/12/17   Janalyn Harder, MD (Inactive) Consulting Physician Dermatology  02/14/20   Shellia Cleverly, DO Consulting Physician Gastroenterology  10/14/20     Chief Complaint  Patient presents with   Hypothyroidism     Subjective: Jacob Aguirre is a 58 y.o. Pt presents for an OV  for Chronic Conditions/illness Management  Hypertension/hyperlipidemia/obesity/Hepatic steatosis/elevated liver enzymes/thrombocytopenia: Pt reports compliance with lisinopril-hctz 20-12.5 daily, amlodipine 10 mg qd, Lipitor 40 mg daily, Tricor 145 mg daily.   Patient denies chest pain, shortness of breath, dizziness or lower extremity edema.  RF: Hypertension, hypertriglyceridemia, obesity, predm.  Family history of stroke in his father.   Psoriasis: Patient reports he has psoriasis on his arms, genitals and legs.  Had been cared for by dermatology until their office closed.  He reports condition is well controlled on Aclovate and clobetasol cream.  Prediabetes/morbid obesity: Patient had been on Mounjaro until prescription coverage changed. He has started seeing a weight loss clinic.  Last A1c 5.7>5.5>5.9>5.6  Hypothyroidism: Patient has had mild elevation above normal TSH that would wax and wane and returned to in the past.  09/2022 TSH 5.72, patient was symptomatic at that time and low-dose levothyroxine 25 mcg daily was started.  Patient reports compliance with levo 25 mg every day on an empty stomach.     03/31/2023    7:47 AM 09/29/2022    8:22 AM 06/29/2022    2:49 PM 04/21/2021   10:30 AM 10/14/2020    8:04 AM  Depression screen PHQ 2/9  Decreased Interest 1 0 0 0 0  Down, Depressed, Hopeless 0 0 0 0 0   PHQ - 2 Score 1 0 0 0 0  Altered sleeping  2     Tired, decreased energy  1     Change in appetite  0     Feeling bad or failure about yourself   0     Trouble concentrating  0     Moving slowly or fidgety/restless  0     Suicidal thoughts  0     PHQ-9 Score  3     Difficult doing work/chores  Not difficult at all       Allergies  Allergen Reactions   Erythromycin    Niacin And Related     Hives   Penicillins     Causes Hives   Social History   Social History Narrative   Married.   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    Nonsmoker.   Drinks caffeine.    Smoke alarm in the home. Wears her seatbelt.    Owns firearms.    Feels safe in his relationships.    Past Medical History:  Diagnosis Date   Gout    Right large toe   Hematuria    pt reports chronic.    HTN (hypertension)    Dr. Elease Hashimoto   Hypercholesterolemia    Meniscal injury 1990   OSA (obstructive sleep apnea)    wears CPAP; Dr. Maple Hudson   Sleep apnea    cpap   Tinea corporis    Past Surgical History:  Procedure Laterality Date   FINGER SURGERY  KNEE ARTHROSCOPY W/ MENISCAL REPAIR Right 1990   KNEE SURGERY     Broken Knee Cap   pilonidal cyst resection     POPLITEAL SYNOVIAL CYST EXCISION Right    3rd grade   REPLACEMENT TOTAL KNEE Right 05/2021   Family History  Problem Relation Age of Onset   Heart murmur Father    Hearing loss Father    Colon polyps Father    Stroke Father    Hypertension Mother    COPD Mother    Testicular cancer Brother 50       And prostate cancer   Colon cancer Neg Hx    Esophageal cancer Neg Hx    Rectal cancer Neg Hx    Stomach cancer Neg Hx    Allergies as of 03/31/2023       Reactions   Erythromycin    Niacin And Related    Hives   Penicillins    Causes Hives        Medication List        Accurate as of March 31, 2023  7:49 AM. If you have any questions, ask your nurse or doctor.          alclomethasone 0.05 % cream Commonly known as:  ACLOVATE APPLY TO AFFECTED AREA EVERY DAY TO TWICE A DAY   amLODipine 10 MG tablet Commonly known as: NORVASC Take 1 tablet (10 mg total) by mouth daily.   atorvastatin 40 MG tablet Commonly known as: LIPITOR Take 1 tablet (40 mg total) by mouth daily.   clobetasol cream 0.05 % Commonly known as: TEMOVATE APPLY TO AFFECTED AREA 1-2 TIMES DAILY not for face or genitals   fenofibrate 145 MG tablet Commonly known as: TRICOR TAKE 1 TAB DAILY WITH MEAL   fluticasone 50 MCG/ACT nasal spray Commonly known as: FLONASE SPRAY 2 SPRAYS INTO EACH NOSTRIL EVERY DAY   levothyroxine 25 MCG tablet Commonly known as: SYNTHROID TAKE 1 TABLET BY MOUTH EVERY DAY   lisinopril-hydrochlorothiazide 20-12.5 MG tablet Commonly known as: ZESTORETIC Take 2 tablets by mouth daily.        All past medical history, surgical history, allergies, family history, immunizations andmedications were updated in the EMR today and reviewed under the history and medication portions of their EMR.     ROS Negative, with the exception of above mentioned in HPI   Objective:  BP 114/78   Pulse 71   Temp 97.8 F (36.6 C)   Wt 264 lb (119.7 kg)   SpO2 98%   BMI 36.82 kg/m  Body mass index is 36.82 kg/m. Physical Exam Vitals and nursing note reviewed.  Constitutional:      General: He is not in acute distress.    Appearance: Normal appearance. He is not ill-appearing, toxic-appearing or diaphoretic.  HENT:     Head: Normocephalic and atraumatic.  Eyes:     General: No scleral icterus.       Right eye: No discharge.        Left eye: No discharge.     Extraocular Movements: Extraocular movements intact.     Pupils: Pupils are equal, round, and reactive to light.  Neck:     Comments: No thyromegaly Cardiovascular:     Rate and Rhythm: Normal rate and regular rhythm.     Heart sounds: No murmur heard. Pulmonary:     Effort: Pulmonary effort is normal. No respiratory distress.     Breath sounds:  Normal breath sounds. No wheezing, rhonchi or rales.  Musculoskeletal:     Cervical back: Neck supple.     Right lower leg: No edema.     Left lower leg: No edema.  Skin:    General: Skin is warm.     Findings: No rash.  Neurological:     Mental Status: He is alert and oriented to person, place, and time. Mental status is at baseline.  Psychiatric:        Mood and Affect: Mood normal.        Behavior: Behavior normal.        Thought Content: Thought content normal.        Judgment: Judgment normal.     No results found. No results found. No results found for this or any previous visit (from the past 24 hours).   Assessment/Plan: HRISTO VANDEKAMP is a 58 y.o. male present for OV for chronic condition management Psoriasis Stable Continue clobetasol Continue Aclovate and more sensitive/thin-skinned areas.  Prediabetes/morbid obesity/hyperlipidemia/hepatic steatosis/OSA-cpap - POCT HgB A1C 6.2> 5.5> 5.6> 5.9> 5.6 > A1c collected today he had gained weight back.  - wegovy starter dose prescribed  Primary hypertension/hyperlipidemia/morbid obesity/family history of stroke in his father Stable Continue lisinopril 20-HCTZ 12.5 mg 2 tabs daily. Continue amlodipine 10 mg daily Continue Lipitor 40 mg nightly. Continue fish oil as long as platelets remain stable. Continue Metamucil/fiber supplement twice daily Labs due next visit Cardiac CT (10/13/2022): ZERO  Hypothyroidism: Continue Levo 25 mcg for now. refills will be provided in appropriate dose based on lab result today TSH and T4 free collected today  Reviewed expectations re: course of current medical issues. Discussed self-management of symptoms. Outlined signs and symptoms indicating need for more acute intervention. Patient verbalized understanding and all questions were answered. Patient received an After-Visit Summary.    Orders Placed This Encounter  Procedures   Flu vaccine trivalent PF, 6mos and  older(Flulaval,Afluria,Fluarix,Fluzone)   TSH   T4, free   No orders of the defined types were placed in this encounter.  Referral Orders  No referral(s) requested today     Note is dictated utilizing voice recognition software. Although note has been proof read prior to signing, occasional typographical errors still can be missed. If any questions arise, please do not hesitate to call for verification.   electronically signed by:  Felix Pacini, DO  [facility] [place]

## 2023-03-31 ENCOUNTER — Encounter: Payer: Self-pay | Admitting: Family Medicine

## 2023-03-31 ENCOUNTER — Ambulatory Visit: Payer: 59 | Admitting: Family Medicine

## 2023-03-31 VITALS — BP 114/78 | HR 71 | Temp 97.8°F | Wt 264.0 lb

## 2023-03-31 DIAGNOSIS — K760 Fatty (change of) liver, not elsewhere classified: Secondary | ICD-10-CM

## 2023-03-31 DIAGNOSIS — E782 Mixed hyperlipidemia: Secondary | ICD-10-CM | POA: Diagnosis not present

## 2023-03-31 DIAGNOSIS — D696 Thrombocytopenia, unspecified: Secondary | ICD-10-CM

## 2023-03-31 DIAGNOSIS — G4733 Obstructive sleep apnea (adult) (pediatric): Secondary | ICD-10-CM | POA: Diagnosis not present

## 2023-03-31 DIAGNOSIS — I10 Essential (primary) hypertension: Secondary | ICD-10-CM | POA: Diagnosis not present

## 2023-03-31 DIAGNOSIS — R7303 Prediabetes: Secondary | ICD-10-CM

## 2023-03-31 DIAGNOSIS — Z23 Encounter for immunization: Secondary | ICD-10-CM | POA: Diagnosis not present

## 2023-03-31 DIAGNOSIS — R946 Abnormal results of thyroid function studies: Secondary | ICD-10-CM | POA: Diagnosis not present

## 2023-03-31 LAB — T4, FREE: Free T4: 1.04 ng/dL (ref 0.60–1.60)

## 2023-03-31 LAB — HEMOGLOBIN A1C: Hgb A1c MFr Bld: 6 % (ref 4.6–6.5)

## 2023-03-31 LAB — TSH: TSH: 3.42 u[IU]/mL (ref 0.35–5.50)

## 2023-03-31 MED ORDER — CLOBETASOL PROPIONATE 0.05 % EX CREA: TOPICAL_CREAM | CUTANEOUS | 4 refills | Status: DC

## 2023-03-31 MED ORDER — LISINOPRIL-HYDROCHLOROTHIAZIDE 20-12.5 MG PO TABS: 2.0000 | ORAL_TABLET | Freq: Every day | ORAL | 1 refills | Status: DC

## 2023-03-31 MED ORDER — WEGOVY 0.25 MG/0.5ML ~~LOC~~ SOAJ: 0.2500 mg | SUBCUTANEOUS | 0 refills | Status: DC

## 2023-03-31 MED ORDER — AMLODIPINE BESYLATE 10 MG PO TABS: 10.0000 mg | ORAL_TABLET | Freq: Every day | ORAL | 1 refills | Status: DC

## 2023-03-31 MED ORDER — ALCLOMETASONE DIPROPIONATE 0.05 % EX CREA: TOPICAL_CREAM | CUTANEOUS | 6 refills | Status: DC

## 2023-03-31 NOTE — Patient Instructions (Signed)

## 2023-04-01 ENCOUNTER — Encounter: Payer: Self-pay | Admitting: Family Medicine

## 2023-04-01 ENCOUNTER — Other Ambulatory Visit: Payer: Self-pay | Admitting: Family Medicine

## 2023-04-01 MED ORDER — LEVOTHYROXINE SODIUM 25 MCG PO TABS: 25.0000 ug | ORAL_TABLET | Freq: Every day | ORAL | 3 refills | Status: DC

## 2023-04-04 ENCOUNTER — Telehealth: Payer: Self-pay

## 2023-04-04 NOTE — Telephone Encounter (Signed)
Copied from CRM [id number]. Topic: Clinical - Medication Question >> Apr 04, 2023 11:07 AM Elizebeth Brooking wrote: Reason for CRM: Patient called in to let Dr.Kuneff know that the medication WEGOVY 0.25 MG/0.5ML SOAJ was approved through his insurance

## 2023-04-05 MED ORDER — WEGOVY 0.5 MG/0.5ML ~~LOC~~ SOAJ: 0.5000 mg | SUBCUTANEOUS | 0 refills | Status: DC

## 2023-04-05 NOTE — Addendum Note (Signed)
Addended by: Felix Pacini A on: 04/05/2023 03:10 PM   Modules accepted: Orders

## 2023-04-05 NOTE — Telephone Encounter (Signed)
Please inform patient I called in the second dose of Wegovy since it was approved. Start with the Wegovy 0.25 mg weekly injection for 4 weeks, then increase to the Wegovy 0.5 mg weekly injection for 4 weeks. Please make sure he has follow-up around the sixth week so that we can reevaluate him and order the next 2 doses.

## 2023-04-06 NOTE — Telephone Encounter (Signed)
Spoke with patient regarding results/recommendations.  

## 2023-05-18 ENCOUNTER — Ambulatory Visit: Payer: 59 | Admitting: Family Medicine

## 2023-05-24 ENCOUNTER — Ambulatory Visit: Payer: 59 | Admitting: Family Medicine

## 2023-05-24 ENCOUNTER — Encounter: Payer: Self-pay | Admitting: Family Medicine

## 2023-05-24 DIAGNOSIS — E782 Mixed hyperlipidemia: Secondary | ICD-10-CM

## 2023-05-24 DIAGNOSIS — R7303 Prediabetes: Secondary | ICD-10-CM

## 2023-05-24 DIAGNOSIS — I10 Essential (primary) hypertension: Secondary | ICD-10-CM | POA: Diagnosis not present

## 2023-05-24 MED ORDER — WEGOVY 1 MG/0.5ML ~~LOC~~ SOAJ: 1.0000 mg | SUBCUTANEOUS | 0 refills | Status: DC

## 2023-05-24 MED ORDER — WEGOVY 1.7 MG/0.75ML ~~LOC~~ SOAJ
1.7000 mg | SUBCUTANEOUS | 0 refills | Status: DC
Start: 2023-06-13 — End: 2023-12-06

## 2023-05-24 NOTE — Patient Instructions (Signed)
 Return in about 6 weeks (around 07/05/2023) for weight loss.        Great to see you today.  I have refilled the medication(s) we provide.   If labs were collected or images ordered, we will inform you of  results once we have received them and reviewed. We will contact you either by echart message, or telephone call.  Please give ample time to the testing facility, and our office to run,  receive and review results. Please do not call inquiring of results, even if you can see them in your chart. We will contact you as soon as we are able. If it has been over 1 week since the test was completed, and you have not yet heard from [place], then please call us.    - echart message- for normal results that have been seen by the patient already.   - telephone call: abnormal results or if patient has not viewed results in their echart.  If a referral to a specialist was entered for you, please call us in 2 weeks if you have not heard from the specialist office to schedule.

## 2023-05-24 NOTE — Progress Notes (Signed)
 Jacob Aguirre , 1966/01/21, 58 y.o., male MRN: [id number] Patient Care Team    Relationship Specialty Notifications Start End  Natalia Leatherwood, DO PCP - General Family Medicine  07/12/17   Nahser, Deloris Ping, MD  Cardiology  08/27/11   Waymon Budge, MD Consulting Physician Pulmonary Disease  07/12/17   Janalyn Harder, MD (Inactive) Consulting Physician Dermatology  02/14/20   Shellia Cleverly, DO Consulting Physician Gastroenterology  10/14/20     Chief Complaint  Patient presents with   Hypertension     Subjective: Jacob Aguirre is a 58 y.o. Pt presents for an OV  for   Prediabetes/morbid obesity: He is feeling well. Changing his diet. Increasing water. Working on exercise.  Last A1c 5.7>5.5>5.9>5.6>6.0 (03/31/2023) Lbs: 264> 261 BMI: 36.82>36.49     05/24/2023    8:24 AM 03/31/2023    7:47 AM 09/29/2022    8:22 AM 06/29/2022    2:49 PM 04/21/2021   10:30 AM  Depression screen PHQ 2/9  Decreased Interest 0 1 0 0 0  Down, Depressed, Hopeless 0 0 0 0 0  PHQ - 2 Score 0 1 0 0 0  Altered sleeping 1  2    Tired, decreased energy 0  1    Change in appetite 1  0    Feeling bad or failure about yourself  0  0    Trouble concentrating 0  0    Moving slowly or fidgety/restless 0  0    Suicidal thoughts 0  0    PHQ-9 Score 2  3    Difficult doing work/chores Not difficult at all  Not difficult at all      Allergies  Allergen Reactions   Erythromycin    Niacin And Related     Hives   Penicillins     Causes Hives   Social History   Social History Narrative   Married.   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    Nonsmoker.   Drinks caffeine.    Smoke alarm in the home. Wears her seatbelt.    Owns firearms.    Feels safe in his relationships.    Past Medical History:  Diagnosis Date   Gout    Right large toe   Hematuria    pt reports chronic.    HTN (hypertension)    Dr. Elease Hashimoto   Hypercholesterolemia    Meniscal injury 1990   OSA (obstructive sleep apnea)     wears CPAP; Dr. Maple Hudson   Sleep apnea    cpap   Tinea corporis    Past Surgical History:  Procedure Laterality Date   FINGER SURGERY     KNEE ARTHROSCOPY W/ MENISCAL REPAIR Right 1990   KNEE SURGERY     Broken Knee Cap   pilonidal cyst resection     POPLITEAL SYNOVIAL CYST EXCISION Right    3rd grade   REPLACEMENT TOTAL KNEE Right 05/2021   Family History  Problem Relation Age of Onset   Heart murmur Father    Hearing loss Father    Colon polyps Father    Stroke Father    Hypertension Mother    COPD Mother    Testicular cancer Brother 44       And prostate cancer   Colon cancer Neg Hx    Esophageal cancer Neg Hx    Rectal cancer Neg Hx    Stomach cancer Neg Hx    Allergies as of 05/24/2023  Reactions   Erythromycin    Niacin And Related    Hives   Penicillins    Causes Hives        Medication List        Accurate as of May 24, 2023  8:29 AM. If you have any questions, ask your nurse or doctor.          alclomethasone 0.05 % cream Commonly known as: ACLOVATE APPLY TO AFFECTED AREA EVERY DAY TO TWICE A DAY   amLODipine 10 MG tablet Commonly known as: NORVASC Take 1 tablet (10 mg total) by mouth daily.   atorvastatin 40 MG tablet Commonly known as: LIPITOR Take 1 tablet (40 mg total) by mouth daily.   clobetasol cream 0.05 % Commonly known as: TEMOVATE APPLY TO AFFECTED AREA 1-2 TIMES DAILY not for face or genitals   fenofibrate 145 MG tablet Commonly known as: TRICOR TAKE 1 TAB DAILY WITH MEAL   fluticasone 50 MCG/ACT nasal spray Commonly known as: FLONASE SPRAY 2 SPRAYS INTO EACH NOSTRIL EVERY DAY   levothyroxine 25 MCG tablet Commonly known as: SYNTHROID Take 1 tablet (25 mcg total) by mouth daily.   lisinopril-hydrochlorothiazide 20-12.5 MG tablet Commonly known as: ZESTORETIC Take 2 tablets by mouth daily.   Wegovy 0.25 MG/0.5ML Soaj Generic drug: Semaglutide-Weight Management Inject 0.25 mg into the skin once a week.    Wegovy 0.5 MG/0.5ML Soaj Generic drug: Semaglutide-Weight Management Inject 0.5 mg into the skin once a week.        All past medical history, surgical history, allergies, family history, immunizations andmedications were updated in the EMR today and reviewed under the history and medication portions of their EMR.     ROS Negative, with the exception of above mentioned in HPI   Objective:  BP 130/80   Pulse 69   Temp 98.3 F (36.8 C)   Wt 261 lb 9.6 oz (118.7 kg)   SpO2 97%   BMI 36.49 kg/m  Body mass index is 36.49 kg/m. Physical Exam Vitals and nursing note reviewed.  Constitutional:      General: He is not in acute distress.    Appearance: Normal appearance. He is not ill-appearing, toxic-appearing or diaphoretic.  HENT:     Head: Normocephalic and atraumatic.  Eyes:     General: No scleral icterus.       Right eye: No discharge.        Left eye: No discharge.     Extraocular Movements: Extraocular movements intact.     Pupils: Pupils are equal, round, and reactive to light.  Neck:     Comments: No thyromegaly Cardiovascular:     Rate and Rhythm: Normal rate and regular rhythm.     Heart sounds: No murmur heard. Pulmonary:     Effort: Pulmonary effort is normal. No respiratory distress.     Breath sounds: Normal breath sounds. No wheezing, rhonchi or rales.  Musculoskeletal:     Right lower leg: No edema.     Left lower leg: No edema.  Skin:    General: Skin is warm.     Findings: No rash.  Neurological:     Mental Status: He is alert and oriented to person, place, and time. Mental status is at baseline.  Psychiatric:        Mood and Affect: Mood normal.        Behavior: Behavior normal.        Thought Content: Thought content normal.  Judgment: Judgment normal.     No results found. No results found. No results found for this or any previous visit (from the past 24 hours).   Assessment/Plan: Jacob Aguirre is a 58 y.o. male present for  OV for  Patient was counseled on exercise, calorie counting, weight loss and potential medications to help with weight loss today. -Patient was provided with online resources for: Weekly net calorie calculator.  Applications for calorie counting.  Patient was advised to ensure she is taking in adequate nutrition daily by meeting calorie goals. -Patient was educated on dietary changes to not only lose weight but to eat healthy.  Patient was educated on glycemic index. -Patient was educated on exercise goal of 150 minutes a week (plus warm up and cool down) of cardiovascular exercise.  Patient was educated on heart rate for cardiovascular and fat burning zones. -Patient was encouraged to maintain adequate water consumption of at least 120 ounces a day, more if exercising/sweating.  Prediabetes/morbid obesity/hyperlipidemia/hepatic steatosis/OSA-cpap - POCT HgB A1C 6.2> 5.5> 5.6> 5.9> 5.6 > 6.0 - wegovy tolerating> increase to 1 mg weekly 4 weekd, then 1.7 mg weekly F/u 6 weeks   Reviewed expectations re: course of current medical issues. Discussed self-management of symptoms. Outlined signs and symptoms indicating need for more acute intervention. Patient verbalized understanding and all questions were answered. Patient received an After-Visit Summary.    No orders of the defined types were placed in this encounter.  No orders of the defined types were placed in this encounter.  Referral Orders  No referral(s) requested today     Note is dictated utilizing voice recognition software. Although note has been proof read prior to signing, occasional typographical errors still can be missed. If any questions arise, please do not hesitate to call for verification.   electronically signed by:  Felix Pacini, DO  [facility] [place]

## 2023-05-26 ENCOUNTER — Other Ambulatory Visit (HOSPITAL_COMMUNITY): Payer: Self-pay

## 2023-05-26 ENCOUNTER — Telehealth: Payer: Self-pay | Admitting: Pharmacy Technician

## 2023-05-26 NOTE — Telephone Encounter (Signed)
 Pharmacy Patient Advocate Encounter   Received notification from CoverMyMeds that prior authorization for [facility] 1.7MG  is required/requested.   Insurance verification completed.   The patient is insured through [facility] .   Per test claim: [id number] SUBMITTED AND PENDING

## 2023-05-26 NOTE — Telephone Encounter (Signed)
 Noted.

## 2023-05-26 NOTE — Telephone Encounter (Signed)
 Pharmacy Patient Advocate Encounter  Received notification from [facility] that Prior Authorization for [facility] 1.7MG  has been APPROVED from 05/26/23 to 12/26/23. Ran test claim, Copay is $24.99. This test claim was processed through [facility]- copay amounts may vary at other pharmacies due to pharmacy/plan contracts, or as the patient moves through the different stages of their insurance plan.   PA #/Case ID/Reference #: [id number]

## 2023-06-20 ENCOUNTER — Other Ambulatory Visit: Payer: Self-pay | Admitting: Family Medicine

## 2023-06-20 MED ORDER — ATORVASTATIN CALCIUM 40 MG PO TABS: 40.0000 mg | ORAL_TABLET | Freq: Every day | ORAL | 0 refills | Status: DC

## 2023-06-20 NOTE — Telephone Encounter (Signed)
 Pt states that pharmacy told him they can not get the alclomethasone cream in due to manufacturer no longer making.

## 2023-07-11 ENCOUNTER — Ambulatory Visit (INDEPENDENT_AMBULATORY_CARE_PROVIDER_SITE_OTHER): Admitting: Family Medicine

## 2023-07-11 ENCOUNTER — Encounter: Payer: Self-pay | Admitting: Family Medicine

## 2023-07-11 VITALS — BP 130/78 | HR 68 | Temp 98.1°F | Wt 256.0 lb

## 2023-07-11 DIAGNOSIS — I10 Essential (primary) hypertension: Secondary | ICD-10-CM | POA: Diagnosis not present

## 2023-07-11 DIAGNOSIS — R7303 Prediabetes: Secondary | ICD-10-CM | POA: Diagnosis not present

## 2023-07-11 DIAGNOSIS — E782 Mixed hyperlipidemia: Secondary | ICD-10-CM | POA: Diagnosis not present

## 2023-07-11 MED ORDER — FLUOCINOLONE ACETONIDE 0.025 % EX CREA: TOPICAL_CREAM | Freq: Two times a day (BID) | CUTANEOUS | 5 refills | Status: AC

## 2023-07-11 MED ORDER — WEGOVY 2.4 MG/0.75ML ~~LOC~~ SOAJ: 2.4000 mg | SUBCUTANEOUS | 5 refills | Status: DC

## 2023-07-11 NOTE — Progress Notes (Signed)
 Jacob Aguirre , Oct 20, 1965, 58 y.o., male MRN: [id number] Patient Care Team    Relationship Specialty Notifications Start End  Mariel Shope, DO PCP - General Family Medicine  07/12/17   Nahser, Lela Purple, MD  Cardiology  08/27/11   Faustina Hood, MD Consulting Physician Pulmonary Disease  07/12/17   Devon Fogo, MD (Inactive) Consulting Physician Dermatology  02/14/20   Annis Kinder, DO Consulting Physician Gastroenterology  10/14/20     Chief Complaint  Patient presents with   Prediabetes     Subjective: Jacob Aguirre is a 58 y.o. Pt presents for an OV  for   Prediabetes/morbid obesity: He is feeling well. Changing his diet. Increasing water. Working on exercise.  Last A1c 5.7>5.5>5.9>5.6>6.0 (03/31/2023) Lbs: 264> 261> 256 BMI: 36.82>36.49> 35.7     07/11/2023   10:18 AM 05/24/2023    8:24 AM 03/31/2023    7:47 AM 09/29/2022    8:22 AM 06/29/2022    2:49 PM  Depression screen PHQ 2/9  Decreased Interest 0 0 1 0 0  Down, Depressed, Hopeless 0 0 0 0 0  PHQ - 2 Score 0 0 1 0 0  Altered sleeping 0 1  2   Tired, decreased energy 0 0  1   Change in appetite 0 1  0   Feeling bad or failure about yourself  0 0  0   Trouble concentrating 0 0  0   Moving slowly or fidgety/restless 0 0  0   Suicidal thoughts 0 0  0   PHQ-9 Score 0 2  3   Difficult doing work/chores Not difficult at all Not difficult at all  Not difficult at all     Allergies  Allergen Reactions   Erythromycin    Niacin  And Related     Hives   Penicillins     Causes Hives   Social History   Social History Narrative   Married.   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    Nonsmoker.   Drinks caffeine.    Smoke alarm in the home. Wears her seatbelt.    Owns firearms.    Feels safe in his relationships.    Past Medical History:  Diagnosis Date   Gout    Right large toe   Hematuria    pt reports chronic.    HTN (hypertension)    Dr. Alroy Aspen   Hypercholesterolemia    Meniscal injury  1990   OSA (obstructive sleep apnea)    wears CPAP; Dr. Linder Revere   Sleep apnea    cpap   Tinea corporis    Past Surgical History:  Procedure Laterality Date   FINGER SURGERY     KNEE ARTHROSCOPY W/ MENISCAL REPAIR Right 1990   KNEE SURGERY     Broken Knee Cap   pilonidal cyst resection     POPLITEAL SYNOVIAL CYST EXCISION Right    3rd grade   REPLACEMENT TOTAL KNEE Right 05/2021   Family History  Problem Relation Age of Onset   Heart murmur Father    Hearing loss Father    Colon polyps Father    Stroke Father    Hypertension Mother    COPD Mother    Testicular cancer Brother 39       And prostate cancer   Colon cancer Neg Hx    Esophageal cancer Neg Hx    Rectal cancer Neg Hx    Stomach cancer Neg Hx  Allergies as of 07/11/2023       Reactions   Erythromycin    Niacin  And Related    Hives   Penicillins    Causes Hives        Medication List        Accurate as of July 11, 2023 10:33 AM. If you have any questions, ask your nurse or doctor.          STOP taking these medications    alclomethasone 0.05 % cream Commonly known as: ACLOVATE  Stopped by: Desira Alessandrini   Wegovy  0.25 MG/0.5ML Soaj Generic drug: Semaglutide -Weight Management Replaced by: Wegovy  2.4 MG/0.75ML Soaj You also have another medication with the same name that you need to continue taking as instructed. Stopped by: Napolean Backbone       TAKE these medications    amLODipine  10 MG tablet Commonly known as: NORVASC  Take 1 tablet (10 mg total) by mouth daily.   atorvastatin  40 MG tablet Commonly known as: LIPITOR Take 1 tablet (40 mg total) by mouth daily.   clobetasol  cream 0.05 % Commonly known as: TEMOVATE  APPLY TO AFFECTED AREA 1-2 TIMES DAILY not for face or genitals   fenofibrate  145 MG tablet Commonly known as: TRICOR  TAKE 1 TAB DAILY WITH MEAL   fluocinolone 0.025 % cream Commonly known as: Synalar Apply topically 2 (two) times daily. To affected area prn Started  by: Magdelena Kinsella   fluticasone  50 MCG/ACT nasal spray Commonly known as: FLONASE  SPRAY 2 SPRAYS INTO EACH NOSTRIL EVERY DAY   levothyroxine  25 MCG tablet Commonly known as: SYNTHROID  Take 1 tablet (25 mcg total) by mouth daily.   lisinopril -hydrochlorothiazide  20-12.5 MG tablet Commonly known as: ZESTORETIC  Take 2 tablets by mouth daily.   Wegovy  1.7 MG/0.75ML Soaj Generic drug: Semaglutide -Weight Management Inject 1.7 mg into the skin once a week. What changed:  Another medication with the same name was added. Make sure you understand how and when to take each. Another medication with the same name was removed. Continue taking this medication, and follow the directions you see here. Changed by: Napolean Backbone   Wegovy  2.4 MG/0.75ML Soaj Generic drug: Semaglutide -Weight Management Inject 2.4 mg into the skin once a week. What changed: You were already taking a medication with the same name, and this prescription was added. Make sure you understand how and when to take each. Replaces: Wegovy  0.25 MG/0.5ML Soaj Changed by: Napolean Backbone        All past medical history, surgical history, allergies, family history, immunizations andmedications were updated in the EMR today and reviewed under the history and medication portions of their EMR.     ROS Negative, with the exception of above mentioned in HPI   Objective:  BP 130/78   Pulse 68   Temp 98.1 F (36.7 C)   Wt 256 lb (116.1 kg)   SpO2 96%   BMI 35.70 kg/m  Body mass index is 35.7 kg/m. Physical Exam Vitals and nursing note reviewed.  Constitutional:      General: He is not in acute distress.    Appearance: Normal appearance. He is not ill-appearing, toxic-appearing or diaphoretic.  HENT:     Head: Normocephalic and atraumatic.  Eyes:     General: No scleral icterus.       Right eye: No discharge.        Left eye: No discharge.     Extraocular Movements: Extraocular movements intact.     Pupils: Pupils are  equal, round, and reactive to  light.  Cardiovascular:     Rate and Rhythm: Normal rate and regular rhythm.     Heart sounds: No murmur heard. Pulmonary:     Effort: Pulmonary effort is normal. No respiratory distress.     Breath sounds: Normal breath sounds. No wheezing, rhonchi or rales.  Musculoskeletal:     Right lower leg: No edema.     Left lower leg: No edema.  Skin:    General: Skin is warm.     Findings: No rash.  Neurological:     Mental Status: He is alert and oriented to person, place, and time. Mental status is at baseline.  Psychiatric:        Mood and Affect: Mood normal.        Behavior: Behavior normal.        Thought Content: Thought content normal.        Judgment: Judgment normal.     No results found. No results found. No results found for this or any previous visit (from the past 24 hours).   Assessment/Plan: Jacob Aguirre is a 58 y.o. male present for OV for  Patient was counseled on exercise, calorie counting, weight loss and potential medications to help with weight loss today. -Patient was provided with online resources for: Weekly net calorie calculator.  Applications for calorie counting.  Patient was advised to ensure she is taking in adequate nutrition daily by meeting calorie goals. -Patient was educated on dietary changes to not only lose weight but to eat healthy.  Patient was educated on glycemic index. -Patient was educated on exercise goal of 150 minutes a week (plus warm up and cool down) of cardiovascular exercise.  Patient was educated on heart rate for cardiovascular and fat burning zones. -Patient was encouraged to maintain adequate water consumption of at least 120 ounces a day, more if exercising/sweating.  Prediabetes/morbid obesity/hyperlipidemia/hepatic steatosis/OSA-cpap - POCT HgB A1C 6.2> 5.5> 5.6> 5.9> 5.6 > 6.0 - wegovy  tolerating> increase to 2.4 mg weekly  F/u 11 weeks   Reviewed expectations re: course of current medical  issues. Discussed self-management of symptoms. Outlined signs and symptoms indicating need for more acute intervention. Patient verbalized understanding and all questions were answered. Patient received an After-Visit Summary.    No orders of the defined types were placed in this encounter.  Meds ordered this encounter  Medications   WEGOVY  2.4 MG/0.75ML SOAJ    Sig: Inject 2.4 mg into the skin once a week.    Dispense:  3 mL    Refill:  5   fluocinolone (SYNALAR) 0.025 % cream    Sig: Apply topically 2 (two) times daily. To affected area prn    Dispense:  60 g    Refill:  5   Referral Orders  No referral(s) requested today     Note is dictated utilizing voice recognition software. Although note has been proof read prior to signing, occasional typographical errors still can be missed. If any questions arise, please do not hesitate to call for verification.   electronically signed by:  Napolean Backbone, DO  [facility] [place]

## 2023-07-11 NOTE — Patient Instructions (Signed)
 Return in about 11 weeks (around 09/26/2023) for Routine chronic condition follow-up.        Great to see you today.  I have refilled the medication(s) we provide.   If labs were collected or images ordered, we will inform you of  results once we have received them and reviewed. We will contact you either by echart message, or telephone call.  Please give ample time to the testing facility, and our office to run,  receive and review results. Please do not call inquiring of results, even if you can see them in your chart. We will contact you as soon as we are able. If it has been over 1 week since the test was completed, and you have not yet heard from us , then please call us .    - echart message- for normal results that have been seen by the patient already.   - telephone call: abnormal results or if patient has not viewed results in their echart.  If a referral to a specialist was entered for you, please call us  in 2 weeks if you have not heard from the specialist office to schedule.

## 2023-07-15 ENCOUNTER — Other Ambulatory Visit: Payer: Self-pay | Admitting: Family Medicine

## 2023-07-16 ENCOUNTER — Other Ambulatory Visit: Payer: Self-pay | Admitting: Family Medicine

## 2023-09-23 ENCOUNTER — Other Ambulatory Visit: Payer: Self-pay | Admitting: Family Medicine

## 2023-10-22 ENCOUNTER — Other Ambulatory Visit: Payer: Self-pay | Admitting: Family Medicine

## 2023-11-15 ENCOUNTER — Other Ambulatory Visit: Payer: Self-pay | Admitting: Family Medicine

## 2023-12-06 ENCOUNTER — Ambulatory Visit: Admitting: Family Medicine

## 2023-12-06 ENCOUNTER — Encounter: Payer: Self-pay | Admitting: Family Medicine

## 2023-12-06 VITALS — BP 128/84 | HR 67 | Temp 98.2°F | Wt 253.0 lb

## 2023-12-06 DIAGNOSIS — I10 Essential (primary) hypertension: Secondary | ICD-10-CM | POA: Diagnosis not present

## 2023-12-06 DIAGNOSIS — K760 Fatty (change of) liver, not elsewhere classified: Secondary | ICD-10-CM

## 2023-12-06 DIAGNOSIS — M7989 Other specified soft tissue disorders: Secondary | ICD-10-CM

## 2023-12-06 DIAGNOSIS — R7303 Prediabetes: Secondary | ICD-10-CM | POA: Diagnosis not present

## 2023-12-06 DIAGNOSIS — G4733 Obstructive sleep apnea (adult) (pediatric): Secondary | ICD-10-CM

## 2023-12-06 DIAGNOSIS — Z23 Encounter for immunization: Secondary | ICD-10-CM

## 2023-12-06 DIAGNOSIS — Z125 Encounter for screening for malignant neoplasm of prostate: Secondary | ICD-10-CM | POA: Diagnosis not present

## 2023-12-06 DIAGNOSIS — D696 Thrombocytopenia, unspecified: Secondary | ICD-10-CM

## 2023-12-06 DIAGNOSIS — E782 Mixed hyperlipidemia: Secondary | ICD-10-CM | POA: Diagnosis not present

## 2023-12-06 DIAGNOSIS — L409 Psoriasis, unspecified: Secondary | ICD-10-CM

## 2023-12-06 LAB — COMPREHENSIVE METABOLIC PANEL WITH GFR
ALT: 43 U/L (ref 0–53)
AST: 28 U/L (ref 0–37)
Albumin: 4.4 g/dL (ref 3.5–5.2)
Alkaline Phosphatase: 47 U/L (ref 39–117)
BUN: 17 mg/dL (ref 6–23)
CO2: 30 meq/L (ref 19–32)
Calcium: 9.6 mg/dL (ref 8.4–10.5)
Chloride: 100 meq/L (ref 96–112)
Creatinine, Ser: 0.88 mg/dL (ref 0.40–1.50)
GFR: 96.27 mL/min (ref >60.00)
Glucose, Bld: 99 mg/dL (ref 70–99)
Potassium: 4.5 meq/L (ref 3.5–5.1)
Sodium: 137 meq/L (ref 135–145)
Total Bilirubin: 0.7 mg/dL (ref 0.2–1.2)
Total Protein: 6.7 g/dL (ref 6.0–8.3)

## 2023-12-06 LAB — LIPID PANEL
Cholesterol: 177 mg/dL (ref 0–200)
HDL: 33.7 mg/dL — ABNORMAL LOW (ref >39.00)
LDL Cholesterol: 105 mg/dL — ABNORMAL HIGH (ref 0–99)
NonHDL: 143.78
Total CHOL/HDL Ratio: 5
Triglycerides: 195 mg/dL — ABNORMAL HIGH (ref 0.0–149.0)
VLDL: 39 mg/dL (ref 0.0–40.0)

## 2023-12-06 LAB — CBC
HCT: 48.2 % (ref 39.0–52.0)
Hemoglobin: 16.1 g/dL (ref 13.0–17.0)
MCHC: 33.5 g/dL (ref 30.0–36.0)
MCV: 89.6 fl (ref 78.0–100.0)
Platelets: 141 K/uL — ABNORMAL LOW (ref 150.0–400.0)
RBC: 5.38 Mil/uL (ref 4.22–5.81)
RDW: 13.9 % (ref 11.5–15.5)
WBC: 7.2 K/uL (ref 4.0–10.5)

## 2023-12-06 LAB — HEMOGLOBIN A1C: Hgb A1c MFr Bld: 5.9 % (ref 4.6–6.5)

## 2023-12-06 LAB — TSH: TSH: 2.44 u[IU]/mL (ref 0.35–5.50)

## 2023-12-06 LAB — PSA: PSA: 3.4 ng/mL (ref 0.10–4.00)

## 2023-12-06 MED ORDER — ATORVASTATIN CALCIUM 40 MG PO TABS: 40.0000 mg | ORAL_TABLET | Freq: Every day | ORAL | 3 refills | Status: AC

## 2023-12-06 MED ORDER — LISINOPRIL-HYDROCHLOROTHIAZIDE 20-12.5 MG PO TABS: 2.0000 | ORAL_TABLET | Freq: Every day | ORAL | 1 refills | Status: AC

## 2023-12-06 MED ORDER — AMLODIPINE BESYLATE 10 MG PO TABS: 10.0000 mg | ORAL_TABLET | Freq: Every day | ORAL | 1 refills | Status: AC

## 2023-12-06 MED ORDER — FENOFIBRATE 145 MG PO TABS: ORAL_TABLET | ORAL | 1 refills | Status: AC

## 2023-12-06 MED ORDER — FLUTICASONE PROPIONATE 50 MCG/ACT NA SUSP: NASAL | 3 refills | Status: AC

## 2023-12-06 MED ORDER — WEGOVY 2.4 MG/0.75ML ~~LOC~~ SOAJ: 2.4000 mg | SUBCUTANEOUS | 5 refills | Status: AC

## 2023-12-06 NOTE — Patient Instructions (Signed)

## 2023-12-06 NOTE — Progress Notes (Signed)
 Jacob Aguirre , 18-May-1965, 58 y.o., male MRN: [id number] Patient Care Team    Relationship Specialty Notifications Start End  Catherine Charlies LABOR, DO PCP - General Family Medicine  07/12/17   Nahser, Aleene PARAS, MD (Inactive)  Cardiology  08/27/11   Neysa Reggy BIRCH, MD Consulting Physician Pulmonary Disease  07/12/17   Livingston Rigg, MD Consulting Physician Dermatology  02/14/20   [place] GAILS, DO Consulting Physician Gastroenterology  10/14/20     Chief Complaint  Patient presents with   Hypertension   Hyperlipidemia    Pt is fasting Chronic condition management Influenza vaccine Prevnar 20     Subjective: Jacob Aguirre is a 58 y.o. Pt presents for an OV  for   Prediabetes/morbid obesity: He is feeling well. Changing his diet. Increasing water. Working on exercise.  Lbs: 264> 261> 256>253 BMI: 36.82>36.49> 35.7>35.29  Hypertension/hyperlipidemia/obesity/Hepatic steatosis/elevated liver enzymes/thrombocytopenia: Pt reports compliance with lisinopril -hctz 20-12.5 daily, amlodipine  10 mg qd, Lipitor 40 mg daily, Tricor  145 mg daily.   Patient denies chest pain, shortness of breath, dizziness or lower extremity edema.  RF: Hypertension, hypertriglyceridemia, obesity, predm.  Family history of stroke in his father.   Psoriasis: Patient reports he has psoriasis on his arms, genitals and legs.  Had been cared for by dermatology until their office closed.  He reports condition is well controlled on Aclovate  and clobetasol  cream.  Hypothyroidism: Patient has had mild elevation above normal TSH that would wax and wane and returned to in the past.  09/2022 TSH 5.72, patient was symptomatic at that time and low-dose levothyroxine  25 mcg daily was started.  Patient reports compliance with levo 25 mg every day on an empty stomach.   Right index finger mass: Patient reports he noted a mass on his right index finger that was painful over the summer after working out in the yard.  He  thought maybe he had a foreign body in the finger and he did poke it with a needle a few times.  He reports once he noticed a white yogurt like drainage from the area.  He reports it intermittently becomes more tender, sometimes little red and then improved on its own. He does have a history of gout.    07/11/2023   10:18 AM 05/24/2023    8:24 AM 03/31/2023    7:47 AM 09/29/2022    8:22 AM 06/29/2022    2:49 PM  Depression screen PHQ 2/9  Decreased Interest 0 0 1 0 0  Down, Depressed, Hopeless 0 0 0 0 0  PHQ - 2 Score 0 0 1 0 0  Altered sleeping 0 1  2   Tired, decreased energy 0 0  1   Change in appetite 0 1  0   Feeling bad or failure about yourself  0 0  0   Trouble concentrating 0 0  0   Moving slowly or fidgety/restless 0 0  0   Suicidal thoughts 0 0  0   PHQ-9 Score 0 2  3   Difficult doing work/chores Not difficult at all Not difficult at all  Not difficult at all     Allergies  Allergen Reactions   Erythromycin    Niacin  And Related     Hives   Penicillins     Causes Hives   Social History   Social History Narrative   Married.   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    Nonsmoker.   Drinks caffeine.  Smoke alarm in the home. Wears her seatbelt.    Owns firearms.    Feels safe in his relationships.    Past Medical History:  Diagnosis Date   Elevated LFTs 12/18/2018   Gout    Right large toe   Hematuria    pt reports chronic.    HTN (hypertension)    Dr. Alveta   Hypercholesterolemia    Meniscal injury 1990   OSA (obstructive sleep apnea)    wears CPAP; Dr. Neysa   Sleep apnea    cpap   Tinea corporis    Past Surgical History:  Procedure Laterality Date   FINGER SURGERY     KNEE ARTHROSCOPY W/ MENISCAL REPAIR Right 1990   KNEE SURGERY     Broken Knee Cap   pilonidal cyst resection     POPLITEAL SYNOVIAL CYST EXCISION Right    3rd grade   REPLACEMENT TOTAL KNEE Right 05/2021   Family History  Problem Relation Age of Onset   Heart murmur Father     Hearing loss Father    Colon polyps Father    Stroke Father    Hypertension Mother    COPD Mother    Testicular cancer Brother 54       And prostate cancer   Colon cancer Neg Hx    Esophageal cancer Neg Hx    Rectal cancer Neg Hx    Stomach cancer Neg Hx    Allergies as of 12/06/2023       Reactions   Erythromycin    Niacin  And Related    Hives   Penicillins    Causes Hives        Medication List        Accurate as of December 06, 2023  3:06 PM. If you have any questions, ask your nurse or doctor.          amLODipine  10 MG tablet Commonly known as: NORVASC  Take 1 tablet (10 mg total) by mouth daily.   atorvastatin  40 MG tablet Commonly known as: LIPITOR Take 1 tablet (40 mg total) by mouth daily.   clobetasol  cream 0.05 % Commonly known as: TEMOVATE  APPLY TO AFFECTED AREA 1-2 TIMES DAILY not for face or genitals   fenofibrate  145 MG tablet Commonly known as: TRICOR  TAKE 1 TAB DAILY WITH MEAL   fluocinolone  0.025 % cream Commonly known as: Synalar  Apply topically 2 (two) times daily. To affected area prn   fluticasone  50 MCG/ACT nasal spray Commonly known as: FLONASE  SPRAY 2 SPRAYS INTO EACH NOSTRIL EVERY DAY   levothyroxine  25 MCG tablet Commonly known as: SYNTHROID  Take 1 tablet (25 mcg total) by mouth daily.   lisinopril -hydrochlorothiazide  20-12.5 MG tablet Commonly known as: ZESTORETIC  Take 2 tablets by mouth daily.   Wegovy  2.4 MG/0.75ML Soaj SQ injection Generic drug: semaglutide -weight management Inject 2.4 mg into the skin once a week. What changed: Another medication with the same name was removed. Continue taking this medication, and follow the directions you see here. Changed by: Charlies Bellini        All past medical history, surgical history, allergies, family history, immunizations andmedications were updated in the EMR today and reviewed under the history and medication portions of their EMR.     ROS Negative, with the  exception of above mentioned in HPI   Objective:  BP 128/84   Pulse 67   Temp 98.2 F (36.8 C)   Wt 253 lb (114.8 kg)   SpO2 95%   BMI 35.29  kg/m  Body mass index is 35.29 kg/m. Physical Exam Vitals and nursing note reviewed.  Constitutional:      General: He is not in acute distress.    Appearance: Normal appearance. He is not ill-appearing, toxic-appearing or diaphoretic.  HENT:     Head: Normocephalic and atraumatic.  Eyes:     General: No scleral icterus.       Right eye: No discharge.        Left eye: No discharge.     Extraocular Movements: Extraocular movements intact.     Pupils: Pupils are equal, round, and reactive to light.  Cardiovascular:     Rate and Rhythm: Normal rate and regular rhythm.     Heart sounds: No murmur heard. Pulmonary:     Effort: Pulmonary effort is normal. No respiratory distress.     Breath sounds: Normal breath sounds. No wheezing, rhonchi or rales.  Musculoskeletal:     Right lower leg: No edema.     Left lower leg: No edema.     Comments: Right index finger: No redness, no purulent drainage.  Mild deformity of nailbed present.  2 cm oval shaped mass of the medial aspect of the DIP joint.  No fluctuance.  Skin:    General: Skin is warm.     Findings: No rash.  Neurological:     Mental Status: He is alert and oriented to person, place, and time. Mental status is at baseline.  Psychiatric:        Mood and Affect: Mood normal.        Behavior: Behavior normal.        Thought Content: Thought content normal.        Judgment: Judgment normal.     No results found. No results found. No results found for this or any previous visit (from the past 24 hours).   Assessment/Plan: SHAAN RHOADS is a 58 y.o. male present for OV for chronic condition management Prediabetes/morbid obesity/hyperlipidemia/hepatic steatosis/OSA-cpap - POCT HgB A1C 6.2> 5.5> 5.6> 5.9> 5.6 > 6.0> A1c collected today - wegovy  tolerating> 2.4 mg weekly Patient  was counseled on exercise, calorie counting, weight loss and potential medications to help with weight loss today. -Patient was provided with online resources for: Weekly net calorie calculator.  Applications for calorie counting.  Patient was advised to ensure she is taking in adequate nutrition daily by meeting calorie goals. -Patient was educated on dietary changes to not only lose weight but to eat healthy.  Patient was educated on glycemic index. -Patient was educated on exercise goal of 150 minutes a week (plus warm up and cool down) of cardiovascular exercise.  Patient was educated on heart rate for cardiovascular and fat burning zones. -Patient was encouraged to maintain adequate water consumption of at least 120 ounces a day, more if exercising/sweating.  Psoriasis Stable Continue clobetasol  Continue Aclovate  and more sensitive/thin-skinned areas.  Primary hypertension/hyperlipidemia/morbid obesity/family history of stroke in his father Stable Continue lisinopril  20-HCTZ 12.5 mg 2 tabs daily. Continue amlodipine  10 mg daily Continue Lipitor 40 mg nightly. Continue fish oil as long as platelets remain stable. Continue Metamucil/fiber supplement twice daily Labs collected today Cardiac CT (10/13/2022): ZERO  Hypothyroidism: Continue levo 25 mcg for  Labs: TSH collected today  Finger mass/pain: New problem. 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  This is very uncomfortable to him.  We discussed referral to hand surgery for further evaluation and he is agreeable to this  approach. -Referral to hand surgery placed -Discussed low purine diet  Influenza vac administered  Declined prevnar  Reviewed expectations re: course of current medical issues. Discussed self-management of symptoms. Outlined signs and symptoms indicating need for more acute intervention. Patient verbalized understanding and all questions were answered. Patient received  an After-Visit Summary.    Orders Placed This Encounter  Procedures   Flu vaccine trivalent PF, 6mos and older(Flulaval,Afluria,Fluarix,Fluzone)   CBC   Comp Met (CMET)   Hemoglobin A1c   TSH   Lipid panel   PSA   Ambulatory referral to Hand Surgery   Meds ordered this encounter  Medications   amLODipine  (NORVASC ) 10 MG tablet    Sig: Take 1 tablet (10 mg total) by mouth daily.    Dispense:  90 tablet    Refill:  1   atorvastatin  (LIPITOR) 40 MG tablet    Sig: Take 1 tablet (40 mg total) by mouth daily.    Dispense:  90 tablet    Refill:  3   fenofibrate  (TRICOR ) 145 MG tablet    Sig: TAKE 1 TAB DAILY WITH MEAL    Dispense:  90 tablet    Refill:  1   fluticasone  (FLONASE ) 50 MCG/ACT nasal spray    Sig: SPRAY 2 SPRAYS INTO EACH NOSTRIL EVERY DAY    Dispense:  48 mL    Refill:  3   WEGOVY  2.4 MG/0.75ML SOAJ SQ injection    Sig: Inject 2.4 mg into the skin once a week.    Dispense:  3 mL    Refill:  5   lisinopril -hydrochlorothiazide  (ZESTORETIC ) 20-12.5 MG tablet    Sig: Take 2 tablets by mouth daily.    Dispense:  180 tablet    Refill:  1   Referral Orders         Ambulatory referral to Hand Surgery     I personally spent a total of 42  minutes in the care of the patient today including preparing to see the patient, getting/reviewing separately obtained history, performing a medically appropriate exam/evaluation, counseling and educating, placing orders, referring and communicating with other health care professionals, documenting clinical information in the EHR, coordinating care, and addressing multiple chronic conditions and new acute concern..   Note is dictated utilizing voice recognition software. Although note has been proof read prior to signing, occasional typographical errors still can be missed. If any questions arise, please do not hesitate to call for verification.   electronically signed by:  Charlies Bellini, DO  [facility] - [place]

## 2023-12-07 ENCOUNTER — Ambulatory Visit: Payer: Self-pay | Admitting: Family Medicine

## 2023-12-07 MED ORDER — LEVOTHYROXINE SODIUM 25 MCG PO TABS: 25.0000 ug | ORAL_TABLET | Freq: Every day | ORAL | 3 refills | Status: AC

## 2023-12-13 DIAGNOSIS — M109 Gout, unspecified: Secondary | ICD-10-CM | POA: Diagnosis not present

## 2023-12-13 DIAGNOSIS — M79645 Pain in left finger(s): Secondary | ICD-10-CM | POA: Diagnosis not present

## 2023-12-30 ENCOUNTER — Telehealth: Payer: Self-pay | Admitting: Pharmacy Technician

## 2023-12-30 ENCOUNTER — Other Ambulatory Visit (HOSPITAL_COMMUNITY): Payer: Self-pay

## 2023-12-30 NOTE — Telephone Encounter (Signed)
 Pharmacy Patient Advocate Encounter   Received notification from CoverMyMeds that prior authorization for Wegovy  1.7MG /0.75ML auto-injectors is due for renewal.   Insurance verification completed.   The patient is insured through [facility].  Action: Medication has been discontinued. Archived Key: [id number]  **Patient is on a higher dose now.**

## 2024-01-19 ENCOUNTER — Telehealth: Payer: Self-pay

## 2024-01-19 ENCOUNTER — Other Ambulatory Visit (HOSPITAL_COMMUNITY): Payer: Self-pay

## 2024-01-19 NOTE — Telephone Encounter (Signed)
 Pharmacy Patient Advocate Encounter   Received notification from Onbase that prior authorization for Wegovy  2.4MG /0.75ML auto-injectors   is required/requested.   Insurance verification completed.   The patient is insured through [facility].   Per test claim: PA required; PA submitted to above mentioned insurance via Latent Key/confirmation #/EOC [id number] Status is pending

## 2024-01-20 ENCOUNTER — Other Ambulatory Visit (HOSPITAL_COMMUNITY): Payer: Self-pay

## 2024-01-23 ENCOUNTER — Other Ambulatory Visit (HOSPITAL_COMMUNITY): Payer: Self-pay

## 2024-01-23 NOTE — Telephone Encounter (Signed)
 LVM to discuss

## 2024-01-23 NOTE — Telephone Encounter (Signed)
 Please call patient and inform him his insurance is going to deny his Wegovy  refills. Sometimes they will decline to continue refills if patient has not lost 5% of body mass since starting medication. The last week we have on this patient was 12/06/2023 and he weighed 253.  If he weighs less now, I would encourage him to come in for an appointment to discuss so that we can document his weight and attempt to continue his Wegovy  prescription.

## 2024-01-23 NOTE — Telephone Encounter (Signed)
 Rep will stop by office this week to assist with this. Per rep: insurance will deny a refill if pt has not lost 5% of their weight from when they started Rx

## 2024-01-23 NOTE — Telephone Encounter (Signed)
 Pharmacy Patient Advocate Encounter  Received notification from [facility] that Prior Authorization for Wegovy  2.4MG /0.75ML auto-injectors   has been DENIED.  See denial reason below. No denial letter attached in CMM. Will attach denial letter to Media tab once received.  PLEASE BE ADVISED OUTCOME FROM PLAN.  WHY YOUR REQUEST WAS DENIED Your plan only covers this drug when you experience benefits from taking the drug. We have DENIED YOUR REQUEST BECAUSE YOU DID NOT HAVE GOOD OUTCOMES FROM THE DRUG. We reviewed the information we had. Your request has been denied. Your doctor can send us  any new or missing information for us  to review. For this drug, you may have to meet other criteria. You can request the drug policy for more details. You can also request other plan documents for your review.   PA #/Case ID/Reference #: [id number]

## 2024-01-23 NOTE — Telephone Encounter (Signed)
 Please find out what other information they are requesting.  Also what ever they be seen there evidence on with you did not have a good outcome from the drug .  What is their criteria for a good outcome?   DENIED YOUR REQUEST BECAUSE YOU DID NOT HAVE GOOD OUTCOMES FROM THE DRUG. We reviewed the information we had. Your request has been denied. Your doctor can send us  any new or missing information for us  to review

## 2024-01-23 NOTE — Telephone Encounter (Signed)
 Message sent to our rep for wegovy  for assistance.

## 2024-01-23 NOTE — Telephone Encounter (Signed)
Will follow up 

## 2024-01-26 ENCOUNTER — Other Ambulatory Visit (HOSPITAL_COMMUNITY): Payer: Self-pay

## 2024-02-17 NOTE — Telephone Encounter (Signed)
 Pt returned my call. Pt given provider instructions. Pt aware and verbalized understanding.

## 2024-02-17 NOTE — Telephone Encounter (Signed)
 LVM to discuss

## 2024-02-17 NOTE — Telephone Encounter (Signed)
 Reason for CRM: Patient states he needs prior auth for his WeGovy  medication. I did confirm the correct [employer] plan that he has right now, as there were 2 on file for him. I removed the old plan and confirmed the correct one to ensure we can submit for prior auth for him. He is not currently out of it yet.

## 2024-04-04 ENCOUNTER — Telehealth: Payer: Self-pay

## 2024-04-04 MED ORDER — CLOBETASOL PROPIONATE 0.05 % EX CREA: TOPICAL_CREAM | CUTANEOUS | 1 refills | Status: AC

## 2024-04-04 NOTE — Telephone Encounter (Signed)
 Communication  Medication: clobetasol  cream (TEMOVATE ) 0.05 %        Has the patient contacted their pharmacy? Yes    (Agent: If no, request that the patient contact the pharmacy for the refill. If patient does not wish to contact the pharmacy document the reason why and proceed with request.)    (Agent: If yes, when and what did the pharmacy advise?)        This is the patient's preferred pharmacy:    [id number] [place] [place] - [address] [place] [address]    [address] [place] [address]    [place] [address]    Phone: [phone] Fax: [phone]   Rx sent.
